# Patient Record
Sex: Male | Born: 1980
Health system: Southern US, Community
[De-identification: ages and names within clinical notes are randomized; demographics above are authoritative.]

## PROBLEM LIST (undated history)

## (undated) DIAGNOSIS — R51 Headache: Secondary | ICD-10-CM

## (undated) DIAGNOSIS — J301 Allergic rhinitis due to pollen: Secondary | ICD-10-CM

## (undated) DIAGNOSIS — E291 Testicular hypofunction: Secondary | ICD-10-CM

## (undated) DIAGNOSIS — Z8659 Personal history of other mental and behavioral disorders: Secondary | ICD-10-CM

## (undated) DIAGNOSIS — G8929 Other chronic pain: Secondary | ICD-10-CM

## (undated) DIAGNOSIS — R519 Headache, unspecified: Secondary | ICD-10-CM

## (undated) DIAGNOSIS — K219 Gastro-esophageal reflux disease without esophagitis: Secondary | ICD-10-CM

## (undated) DIAGNOSIS — M549 Dorsalgia, unspecified: Secondary | ICD-10-CM

## (undated) DIAGNOSIS — M5126 Other intervertebral disc displacement, lumbar region: Secondary | ICD-10-CM

## (undated) HISTORY — DX: Gastro-esophageal reflux disease without esophagitis: K21.9

## (undated) HISTORY — DX: Allergic rhinitis due to pollen: J30.1

## (undated) HISTORY — DX: Headache: R51

## (undated) HISTORY — DX: Testicular hypofunction: E29.1

## (undated) HISTORY — DX: Personal history of other mental and behavioral disorders: Z86.59

## (undated) HISTORY — DX: Headache, unspecified: R51.9

## (undated) HISTORY — PX: BACK SURGERY: SHX140

---

## 2006-05-14 ENCOUNTER — Emergency Department (HOSPITAL_COMMUNITY): Admission: EM | Admit: 2006-05-14 | Discharge: 2006-05-15 | Payer: Self-pay | Admitting: Emergency Medicine

## 2007-07-21 ENCOUNTER — Emergency Department (HOSPITAL_COMMUNITY): Admission: EM | Admit: 2007-07-21 | Discharge: 2007-07-21 | Payer: Self-pay | Admitting: Emergency Medicine

## 2008-01-23 ENCOUNTER — Emergency Department (HOSPITAL_COMMUNITY): Admission: EM | Admit: 2008-01-23 | Discharge: 2008-01-23 | Payer: Self-pay | Admitting: Emergency Medicine

## 2009-05-07 ENCOUNTER — Emergency Department (HOSPITAL_BASED_OUTPATIENT_CLINIC_OR_DEPARTMENT_OTHER): Admission: EM | Admit: 2009-05-07 | Discharge: 2009-05-08 | Payer: Self-pay | Admitting: Emergency Medicine

## 2010-01-25 ENCOUNTER — Ambulatory Visit: Payer: Self-pay | Admitting: Diagnostic Radiology

## 2010-01-25 ENCOUNTER — Emergency Department (HOSPITAL_BASED_OUTPATIENT_CLINIC_OR_DEPARTMENT_OTHER): Admission: EM | Admit: 2010-01-25 | Discharge: 2010-01-25 | Payer: Self-pay | Admitting: Emergency Medicine

## 2010-03-28 ENCOUNTER — Emergency Department (HOSPITAL_BASED_OUTPATIENT_CLINIC_OR_DEPARTMENT_OTHER): Admission: EM | Admit: 2010-03-28 | Discharge: 2010-03-28 | Payer: Self-pay | Admitting: Emergency Medicine

## 2010-03-28 ENCOUNTER — Ambulatory Visit: Payer: Self-pay | Admitting: Diagnostic Radiology

## 2011-02-13 LAB — CBC
Hemoglobin: 15.6 g/dL (ref 13.0–17.0)
MCHC: 34.1 g/dL (ref 30.0–36.0)
MCV: 85.7 fL (ref 78.0–100.0)
RBC: 5.34 MIL/uL (ref 4.22–5.81)
WBC: 12.7 10*3/uL — ABNORMAL HIGH (ref 4.0–10.5)

## 2011-02-13 LAB — DIFFERENTIAL
Basophils Absolute: 0.1 10*3/uL (ref 0.0–0.1)
Basophils Relative: 1 % (ref 0–1)
Lymphs Abs: 2.9 10*3/uL (ref 0.7–4.0)
Monocytes Absolute: 1.3 10*3/uL — ABNORMAL HIGH (ref 0.1–1.0)
Smear Review: NORMAL

## 2011-02-13 LAB — BASIC METABOLIC PANEL
BUN: 10 mg/dL (ref 6–23)
CO2: 26 mEq/L (ref 19–32)
Calcium: 9.5 mg/dL (ref 8.4–10.5)
GFR calc non Af Amer: >60 mL/min (ref >60)

## 2011-05-22 ENCOUNTER — Emergency Department (HOSPITAL_BASED_OUTPATIENT_CLINIC_OR_DEPARTMENT_OTHER): Payer: PRIVATE HEALTH INSURANCE

## 2011-05-22 ENCOUNTER — Encounter: Payer: Self-pay | Admitting: Emergency Medicine

## 2011-05-22 ENCOUNTER — Emergency Department (HOSPITAL_BASED_OUTPATIENT_CLINIC_OR_DEPARTMENT_OTHER)
Admission: EM | Admit: 2011-05-22 | Discharge: 2011-05-22 | Disposition: A | Discharge status: Home / Self Care | Payer: PRIVATE HEALTH INSURANCE | Attending: Emergency Medicine | Admitting: Emergency Medicine

## 2011-05-22 ENCOUNTER — Other Ambulatory Visit: Payer: Self-pay

## 2011-05-22 ENCOUNTER — Emergency Department (INDEPENDENT_AMBULATORY_CARE_PROVIDER_SITE_OTHER): Payer: PRIVATE HEALTH INSURANCE

## 2011-05-22 DIAGNOSIS — R079 Chest pain, unspecified: Secondary | ICD-10-CM

## 2011-05-22 DIAGNOSIS — R0789 Other chest pain: Secondary | ICD-10-CM

## 2011-05-22 DIAGNOSIS — R0602 Shortness of breath: Secondary | ICD-10-CM

## 2011-05-22 LAB — CBC
HCT: 41.8 % (ref 39.0–52.0)
MCH: 28.2 pg (ref 26.0–34.0)
MCV: 80.9 fL (ref 78.0–100.0)
Platelets: 245 10*3/uL (ref 150–400)
WBC: 9 10*3/uL (ref 4.0–10.5)

## 2011-05-22 LAB — CARDIAC PANEL(CRET KIN+CKTOT+MB+TROPI)
CK, MB: 1.4 ng/mL (ref 0.3–4.0)
Total CK: 163 U/L (ref 7–232)
Troponin I: <0.3 ng/mL (ref <0.30)

## 2011-05-22 LAB — BASIC METABOLIC PANEL
Chloride: 105 mEq/L (ref 96–112)
Potassium: 3.8 mEq/L (ref 3.5–5.1)
Sodium: 143 mEq/L (ref 135–145)

## 2011-05-22 NOTE — ED Notes (Signed)
No PCP 

## 2011-05-22 NOTE — ED Provider Notes (Signed)
History     Chief Complaint  Patient presents with  . Chest Pain   Patient is a 30 y.o. male presenting with chest pain. The history is provided by the patient.  Chest Pain The chest pain began 3 - 5 hours ago. Duration of episode(s) is 5 hours. Chest pain occurs constantly. The chest pain is unchanged. Associated with: nothing.  Was at rest when symptoms began. The severity of the pain is moderate. The quality of the pain is described as sharp. The pain does not radiate. Chest pain is worsened by deep breathing and certain positions. Pertinent negatives for primary symptoms include no fever, no fatigue, no syncope, no shortness of breath, no palpitations and no nausea. He tried nothing for the symptoms. Risk factors include smoking/tobacco exposure.     History reviewed. No pertinent past medical history.  History reviewed. No pertinent past surgical history.  History reviewed. No pertinent family history.  History  Substance Use Topics  . Smoking status: Current Everyday Smoker  . Smokeless tobacco: Not on file  . Alcohol Use: Yes     occ      Review of Systems  Constitutional: Negative.  Negative for fever and fatigue.  HENT: Negative.   Respiratory: Negative.  Negative for shortness of breath.   Cardiovascular: Positive for chest pain. Negative for palpitations and syncope.  Gastrointestinal: Negative for nausea.  All other systems reviewed and are negative.    Physical Exam  BP 142/81  Pulse 85  Temp(Src) 98.5 F (36.9 C) (Oral)  Resp 16  SpO2 99%  Physical Exam  Constitutional: He is oriented to person, place, and time. He appears well-developed and well-nourished. No distress.  HENT:  Head: Normocephalic and atraumatic.  Neck: Normal range of motion. Neck supple.  Cardiovascular: Normal rate, regular rhythm and normal heart sounds.   Pulmonary/Chest: Effort normal and breath sounds normal. No respiratory distress. He has no wheezes. He has no rales. He  exhibits no tenderness.  Abdominal: Soft. He exhibits no distension and no mass.  Musculoskeletal: Normal range of motion.  Neurological: He is alert and oriented to person, place, and time.  Skin: Skin is warm and dry. He is not diaphoretic.  Psychiatric: He has a normal mood and affect.    ED Course  Procedures  MDM Enzymes normal, EKG normal, CXR normal.  Symptoms atypical for cardiac pain.  Will discharge with anti-inflammatories.      Riley Lam [facility] 05/22/11 1930

## 2011-06-05 ENCOUNTER — Encounter (HOSPITAL_BASED_OUTPATIENT_CLINIC_OR_DEPARTMENT_OTHER): Payer: Self-pay | Admitting: *Deleted

## 2011-06-05 ENCOUNTER — Emergency Department (INDEPENDENT_AMBULATORY_CARE_PROVIDER_SITE_OTHER): Payer: PRIVATE HEALTH INSURANCE

## 2011-06-05 ENCOUNTER — Emergency Department (HOSPITAL_BASED_OUTPATIENT_CLINIC_OR_DEPARTMENT_OTHER)
Admission: EM | Admit: 2011-06-05 | Discharge: 2011-06-05 | Disposition: A | Discharge status: Home / Self Care | Payer: PRIVATE HEALTH INSURANCE | Attending: Emergency Medicine | Admitting: Emergency Medicine

## 2011-06-05 DIAGNOSIS — S62609A Fracture of unspecified phalanx of unspecified finger, initial encounter for closed fracture: Secondary | ICD-10-CM | POA: Insufficient documentation

## 2011-06-05 DIAGNOSIS — F319 Bipolar disorder, unspecified: Secondary | ICD-10-CM | POA: Insufficient documentation

## 2011-06-05 DIAGNOSIS — W219XXA Striking against or struck by unspecified sports equipment, initial encounter: Secondary | ICD-10-CM | POA: Insufficient documentation

## 2011-06-05 DIAGNOSIS — Y9364 Activity, baseball: Secondary | ICD-10-CM | POA: Insufficient documentation

## 2011-06-05 DIAGNOSIS — Y9239 Other specified sports and athletic area as the place of occurrence of the external cause: Secondary | ICD-10-CM | POA: Insufficient documentation

## 2011-06-05 DIAGNOSIS — X58XXXA Exposure to other specified factors, initial encounter: Secondary | ICD-10-CM

## 2011-06-05 MED ORDER — HYDROCODONE-ACETAMINOPHEN 5-500 MG PO TABS: 1.0000 | ORAL_TABLET | Freq: Four times a day (QID) | ORAL | Status: AC | PRN

## 2011-06-05 NOTE — ED Provider Notes (Signed)
History     Chief Complaint  Patient presents with  . Hand Pain   HPI Comments: Pt states that he was playing softball yesterday and the tip of his finger made contact with something and he has had pain and swelling since  Patient is a 30 y.o. male presenting with hand pain. The history is provided by the patient. No language interpreter was used.  Hand Pain This is a new problem. The current episode started yesterday. The problem occurs constantly. The problem has been unchanged. Associated symptoms include joint swelling. Pertinent negatives include no fever. The symptoms are aggravated by bending. He has tried nothing for the symptoms.    Past Medical History  Diagnosis Date  . Bipolar 1 disorder     History reviewed. No pertinent past surgical history.  No family history on file.  History  Substance Use Topics  . Smoking status: Current Everyday Smoker  . Smokeless tobacco: Not on file  . Alcohol Use: No     occ      Review of Systems  Constitutional: Negative.  Negative for fever.  Respiratory: Negative.   Cardiovascular: Negative.   Musculoskeletal: Positive for joint swelling.  Skin: Negative.   Neurological: Negative.     Physical Exam  BP 120/78  Pulse 86  Temp(Src) 98.5 F (36.9 C) (Oral)  Resp 20  SpO2 99%  Physical Exam  Nursing note and vitals reviewed. Constitutional: He is oriented to person, place, and time. He appears well-developed.  Cardiovascular: Normal rate and regular rhythm.   Pulmonary/Chest: Effort normal and breath sounds normal.  Musculoskeletal:       Hands: Neurological: He is alert and oriented to person, place, and time.  Skin: Skin is warm and dry.    ED Course  Procedures  Dg Finger Index Right  06/05/2011  *RADIOLOGY REPORT*  Clinical Data: Index finger injury.  Pain and swelling.  RIGHT INDEX FINGER 2+V  Comparison: None.  Findings: On the lateral view, small volar plate avulsion fractures are seen from the distal  and middle phalanges of the index finger. There is associated soft tissue swelling.  No evidence of dislocation.  IMPRESSION: Small volar plate avulsion fractures from the middle and distal phalanges.  Original Report Authenticated By: Danae Orleans, M.D.    MDM Pt splinted by nursing staff:will treat symptomatically and have follow up with orthopedics      Teressa Lower, NP 06/05/11 2056

## 2011-06-05 NOTE — ED Notes (Signed)
Right 2nd digit red, swollen, painful. Started after softball hit the tip of his finger.

## 2011-06-05 NOTE — ED Provider Notes (Signed)
Medical screening examination/treatment/procedure(s) were performed by non-physician practitioner and as supervising physician I was immediately available for consultation/collaboration.   Glynn Octave, MD 06/05/11 [phone]

## 2012-06-18 ENCOUNTER — Emergency Department (HOSPITAL_COMMUNITY): Payer: Self-pay

## 2012-06-18 ENCOUNTER — Encounter (HOSPITAL_COMMUNITY): Payer: Self-pay

## 2012-06-18 ENCOUNTER — Emergency Department (HOSPITAL_COMMUNITY)
Admission: EM | Admit: 2012-06-18 | Discharge: 2012-06-18 | Disposition: A | Discharge status: Home / Self Care | Payer: Self-pay | Attending: Emergency Medicine | Admitting: Emergency Medicine

## 2012-06-18 DIAGNOSIS — F319 Bipolar disorder, unspecified: Secondary | ICD-10-CM | POA: Insufficient documentation

## 2012-06-18 DIAGNOSIS — M549 Dorsalgia, unspecified: Secondary | ICD-10-CM | POA: Insufficient documentation

## 2012-06-18 DIAGNOSIS — R0602 Shortness of breath: Secondary | ICD-10-CM | POA: Insufficient documentation

## 2012-06-18 DIAGNOSIS — F17200 Nicotine dependence, unspecified, uncomplicated: Secondary | ICD-10-CM | POA: Insufficient documentation

## 2012-06-18 DIAGNOSIS — R269 Unspecified abnormalities of gait and mobility: Secondary | ICD-10-CM | POA: Insufficient documentation

## 2012-06-18 DIAGNOSIS — R32 Unspecified urinary incontinence: Secondary | ICD-10-CM | POA: Insufficient documentation

## 2012-06-18 LAB — CBC WITH DIFFERENTIAL/PLATELET
Basophils Absolute: 0.1 10*3/uL (ref 0.0–0.1)
Basophils Relative: 1 % (ref 0–1)
Eosinophils Relative: 4 % (ref 0–5)
HCT: 42.1 % (ref 39.0–52.0)
Hemoglobin: 14.6 g/dL (ref 13.0–17.0)
Hemoglobin: 14.3 g/dL (ref 13.0–17.0)
Lymphocytes Relative: 27 % (ref 12–46)
Lymphs Abs: 2.8 10*3/uL (ref 0.7–4.0)
MCH: 29.5 pg (ref 26.0–34.0)
MCHC: 34.7 g/dL (ref 30.0–36.0)
MCV: 86.4 fL (ref 78.0–100.0)
Monocytes Absolute: 0.9 10*3/uL (ref 0.1–1.0)
Monocytes Relative: 8 % (ref 3–12)
Monocytes Relative: 7 % (ref 3–12)
Neutro Abs: 6.6 10*3/uL (ref 1.7–7.7)
Neutrophils Relative %: 63 % (ref 43–77)
Platelets: 253 10*3/uL (ref 150–400)
RBC: 4.84 MIL/uL (ref 4.22–5.81)
RDW: 12.8 % (ref 11.5–15.5)
WBC: 10.6 10*3/uL — ABNORMAL HIGH (ref 4.0–10.5)

## 2012-06-18 LAB — BASIC METABOLIC PANEL
BUN: 14 mg/dL (ref 6–23)
CO2: 26 mEq/L (ref 19–32)
Chloride: 107 mEq/L (ref 96–112)
GFR calc non Af Amer: 86 mL/min — ABNORMAL LOW (ref >90)
Glucose, Bld: 89 mg/dL (ref 70–99)
Potassium: 3.8 mEq/L (ref 3.5–5.1)
Sodium: 143 mEq/L (ref 135–145)

## 2012-06-18 LAB — COMPREHENSIVE METABOLIC PANEL
AST: 18 U/L (ref 0–37)
Albumin: 4.4 g/dL (ref 3.5–5.2)
BUN: 13 mg/dL (ref 6–23)
CO2: 26 mEq/L (ref 19–32)
Calcium: 9.6 mg/dL (ref 8.4–10.5)
Chloride: 105 mEq/L (ref 96–112)
Creatinine, Ser: 1.12 mg/dL (ref 0.50–1.35)
GFR calc non Af Amer: 86 mL/min — ABNORMAL LOW (ref >90)
Total Bilirubin: 0.2 mg/dL — ABNORMAL LOW (ref 0.3–1.2)

## 2012-06-18 LAB — URINALYSIS, ROUTINE W REFLEX MICROSCOPIC
Bilirubin Urine: NEGATIVE
Glucose, UA: NEGATIVE mg/dL
Hgb urine dipstick: NEGATIVE
Ketones, ur: NEGATIVE mg/dL
Protein, ur: NEGATIVE mg/dL
pH: 6 (ref 5.0–8.0)

## 2012-06-18 MED ORDER — DIPHENHYDRAMINE HCL 50 MG/ML IJ SOLN
INTRAMUSCULAR | Status: AC
  Filled 2012-06-18: qty 1

## 2012-06-18 MED ORDER — MORPHINE SULFATE 4 MG/ML IJ SOLN
4.0000 mg | Freq: Once | INTRAMUSCULAR | Status: AC
  Administered 2012-06-18: 4 mg via INTRAVENOUS
  Filled 2012-06-18: qty 1

## 2012-06-18 MED ORDER — DIPHENHYDRAMINE HCL 50 MG/ML IJ SOLN
25.0000 mg | Freq: Once | INTRAMUSCULAR | Status: AC
  Administered 2012-06-18: 25 mg via INTRAVENOUS

## 2012-06-18 MED ORDER — SODIUM CHLORIDE 0.9 % IV BOLUS (SEPSIS)
1000.0000 mL | Freq: Once | INTRAVENOUS | Status: AC
  Administered 2012-06-18: 1000 mL via INTRAVENOUS

## 2012-06-18 MED ORDER — OXYCODONE-ACETAMINOPHEN 5-325 MG PO TABS: 1.0000 | ORAL_TABLET | ORAL | Status: AC | PRN

## 2012-06-18 MED ORDER — CYCLOBENZAPRINE HCL 10 MG PO TABS: 10.0000 mg | ORAL_TABLET | Freq: Two times a day (BID) | ORAL | Status: AC | PRN

## 2012-06-18 NOTE — ED Provider Notes (Signed)
Patient complains of mild to moderate continuing pain. MRI shows urinary distention, mild disc bulging without significant canal narrowing. Patient has urinated with complete control, no incontinence. No retention. He reports he feels ready for discharge home.   Rodena Medin, PA-C 06/18/12 2204

## 2012-06-18 NOTE — ED Provider Notes (Signed)
History     CSN: [id number]  Arrival date & time 06/18/12  1516   First MD Initiated Contact with Patient 06/18/12 1728      Chief Complaint  Patient presents with  . Back Pain  . Shortness of Breath  . Urinary Incontinence    (Consider location/radiation/quality/duration/timing/severity/associated sxs/prior treatment) HPI  31 year old male with history of bipolar presents complaining of back pain. Patient reports he has low back pain ongoing for several years.  Usually receive cortisone shots from orthopeic,  Dr. Lawernce Keas.  Sts today while standing in the kitchen he notice loss of urinary continence by peeing on himself.  Also reports having increase weakness to his L leg, with difficulty walking.  He denies any significant increase in his usual low back pain.  Denies fever, chills, abd pain, rash, caudal equina sxs.  Denies any recent strenuous exercise.    Past Medical History  Diagnosis Date  . Bipolar 1 disorder     No past surgical history on file.  No family history on file.  History  Substance Use Topics  . Smoking status: Current Everyday Smoker  . Smokeless tobacco: Not on file  . Alcohol Use: No     occ      Review of Systems  Constitutional: Negative for fever.  Musculoskeletal: Positive for back pain and gait problem.  Skin: Negative for rash.  Neurological: Positive for numbness.  All other systems reviewed and are negative.    Allergies  Review of patient's allergies indicates no known allergies.  Home Medications   Current Outpatient Rx  Name Route Sig Dispense Refill  . SERTRALINE HCL 50 MG PO TABS Oral Take 50 mg by mouth daily.       BP 139/84  Pulse 85  Temp 98.3 F (36.8 C) (Oral)  Resp 18  SpO2 100%  Physical Exam  Nursing note and vitals reviewed. Constitutional: He is oriented to person, place, and time. He appears well-nourished. No distress.  HENT:  Head: Atraumatic.  Eyes: Conjunctivae are normal.  Neck: Normal range  of motion. Neck supple.  Abdominal: Soft. There is no tenderness.  Musculoskeletal: He exhibits no edema.       Cervical back: Normal.       Thoracic back: Normal.       Lumbar back: He exhibits tenderness, bony tenderness and pain. He exhibits normal range of motion, no swelling, no edema, no deformity and no laceration.  Neurological: He is alert and oriented to person, place, and time.       Increase low back pain with straight leg test.  5/5 strength to R lower extremity, 4/5 strength to L lower extremity.  Mild evidence of foot drop.  Patella DTR 2+ on R, 3+ on L.    Decreased rectal tone on exam.  Gait mildly antalgic, favoring R side.   Skin: Skin is warm. No rash noted.  Psychiatric: He has a normal mood and affect.    ED Course  Procedures (including critical care time)   Labs Reviewed  URINALYSIS, ROUTINE W REFLEX MICROSCOPIC  CBC WITH DIFFERENTIAL  COMPREHENSIVE METABOLIC PANEL   Results for orders placed during the hospital encounter of 06/18/12  URINALYSIS, ROUTINE W REFLEX MICROSCOPIC      Component Value Range   Color, Urine YELLOW  YELLOW   APPearance CLEAR  CLEAR   Specific Gravity, Urine 1.029  1.005 - 1.030   pH 6.0  5.0 - 8.0   Glucose, UA NEGATIVE  NEGATIVE mg/dL  Hgb urine dipstick NEGATIVE  NEGATIVE   Bilirubin Urine NEGATIVE  NEGATIVE   Ketones, ur NEGATIVE  NEGATIVE mg/dL   Protein, ur NEGATIVE  NEGATIVE mg/dL   Urobilinogen, UA 1.0  0.0 - 1.0 mg/dL   Nitrite NEGATIVE  NEGATIVE   Leukocytes, UA NEGATIVE  NEGATIVE  CBC WITH DIFFERENTIAL      Component Value Range   WBC 10.5  4.0 - 10.5 K/uL   RBC 4.87  4.22 - 5.81 MIL/uL   Hemoglobin 14.6  13.0 - 17.0 g/dL   HCT 16.1  09.6 - 04.5 %   MCV 86.4  78.0 - 100.0 fL   MCH 30.0  26.0 - 34.0 pg   MCHC 34.7  30.0 - 36.0 g/dL   RDW 40.9  81.1 - 91.4 %   Platelets 264  150 - 400 K/uL   Neutrophils Relative 60  43 - 77 %   Neutro Abs 6.4  1.7 - 7.7 K/uL   Lymphocytes Relative 27  12 - 46 %   Lymphs Abs  2.9  0.7 - 4.0 K/uL   Monocytes Relative 8  3 - 12 %   Monocytes Absolute 0.9  0.1 - 1.0 K/uL   Eosinophils Relative 4  0 - 5 %   Eosinophils Absolute 0.4  0.0 - 0.7 K/uL   Basophils Relative 1  0 - 1 %   Basophils Absolute 0.1  0.0 - 0.1 K/uL  COMPREHENSIVE METABOLIC PANEL      Component Value Range   Sodium 141  135 - 145 mEq/L   Potassium 3.8  3.5 - 5.1 mEq/L   Chloride 105  96 - 112 mEq/L   CO2 26  19 - 32 mEq/L   Glucose, Bld 88  70 - 99 mg/dL   BUN 13  6 - 23 mg/dL   Creatinine, Ser 7.82  0.50 - 1.35 mg/dL   Calcium 9.6  8.4 - 95.6 mg/dL   Total Protein 7.9  6.0 - 8.3 g/dL   Albumin 4.4  3.5 - 5.2 g/dL   AST 18  0 - 37 U/L   ALT 15  0 - 53 U/L   Alkaline Phosphatase 56  39 - 117 U/L   Total Bilirubin 0.2 (*) 0.3 - 1.2 mg/dL   GFR calc non Af Amer 86 (*) >90 mL/min   GFR calc Af Amer >90  >90 mL/min  CBC WITH DIFFERENTIAL      Component Value Range   WBC 10.6 (*) 4.0 - 10.5 K/uL   RBC 4.84  4.22 - 5.81 MIL/uL   Hemoglobin 14.3  13.0 - 17.0 g/dL   HCT 21.3  08.6 - 57.8 %   MCV 86.4  78.0 - 100.0 fL   MCH 29.5  26.0 - 34.0 pg   MCHC 34.2  30.0 - 36.0 g/dL   RDW 46.9  62.9 - 52.8 %   Platelets 253  150 - 400 K/uL   Neutrophils Relative 63  43 - 77 %   Neutro Abs 6.6  1.7 - 7.7 K/uL   Lymphocytes Relative 27  12 - 46 %   Lymphs Abs 2.8  0.7 - 4.0 K/uL   Monocytes Relative 7  3 - 12 %   Monocytes Absolute 0.8  0.1 - 1.0 K/uL   Eosinophils Relative 3  0 - 5 %   Eosinophils Absolute 0.3  0.0 - 0.7 K/uL   Basophils Relative 1  0 - 1 %   Basophils Absolute 0.1  0.0 -  0.1 K/uL   No results found.  1. Back pain 2. Urinary incontinence  MDM  Low back pain with urinary incontinence and L side foot drop.  Plan to obtain MRI of Lspine for further evaluation.  Discussed with my attending.    Nursing notes reviewed and considered in documentation  Previous records reviewed and considered  All labs/vitals reviewed and considered  xrays reviewed and considered   6:45  PM Discussed care with Langley Adie, PA-C who agrees to monitoring pt and f/u in MRI result.    BP 139/84  Pulse 85  Temp 98.3 F (36.8 C) (Oral)  Resp 18  SpO2 100%   7:30 PM Pt developed a mild reaction to morphine with rash and itch.  It has improved with benadryl.      Fayrene Helper, PA-C 06/18/12 1846  Fayrene Helper, PA-C 06/18/12 1930

## 2012-06-18 NOTE — ED Provider Notes (Addendum)
Randall Abbott is a 31 y.o. male with ongoing back pain, and new onset left foot, weakness, causing falling while climbing stairs, as well as urinary incontinence, today. Patient is alert, calm, and cooperative. While resting supine. He has good dorsi flexion of the feet, bilaterally.   Evaluation to proceed with MR of the lumbar spine. He is transferred to the clinical decision unit.   Medical screening examination/treatment/procedure(s) were conducted as a shared visit with non-physician practitioner(s) and myself.  I personally evaluated the patient during the encounter    Flint Melter, MD 06/18/12 [place]  Flint Melter, MD 06/19/12 [phone]

## 2012-06-18 NOTE — ED Notes (Signed)
Low back pain ongoing for several years, urinary incontinence and sts left foot and right foot "drops" and he trips often

## 2012-06-19 NOTE — ED Provider Notes (Signed)
Medical screening examination/treatment/procedure(s) were performed by non-physician practitioner and as supervising physician I was immediately available for consultation/collaboration.  Flint Melter, MD 06/19/12 [phone]

## 2012-09-24 IMAGING — CR DG CHEST 2V
2 series · 2 of 2 positions shown · non-contrast
Comparison: 08/22/2008

CLINICAL DATA: Chest pain.  Shortness of breath.

CHEST - 2 VIEW

[w chest pa]
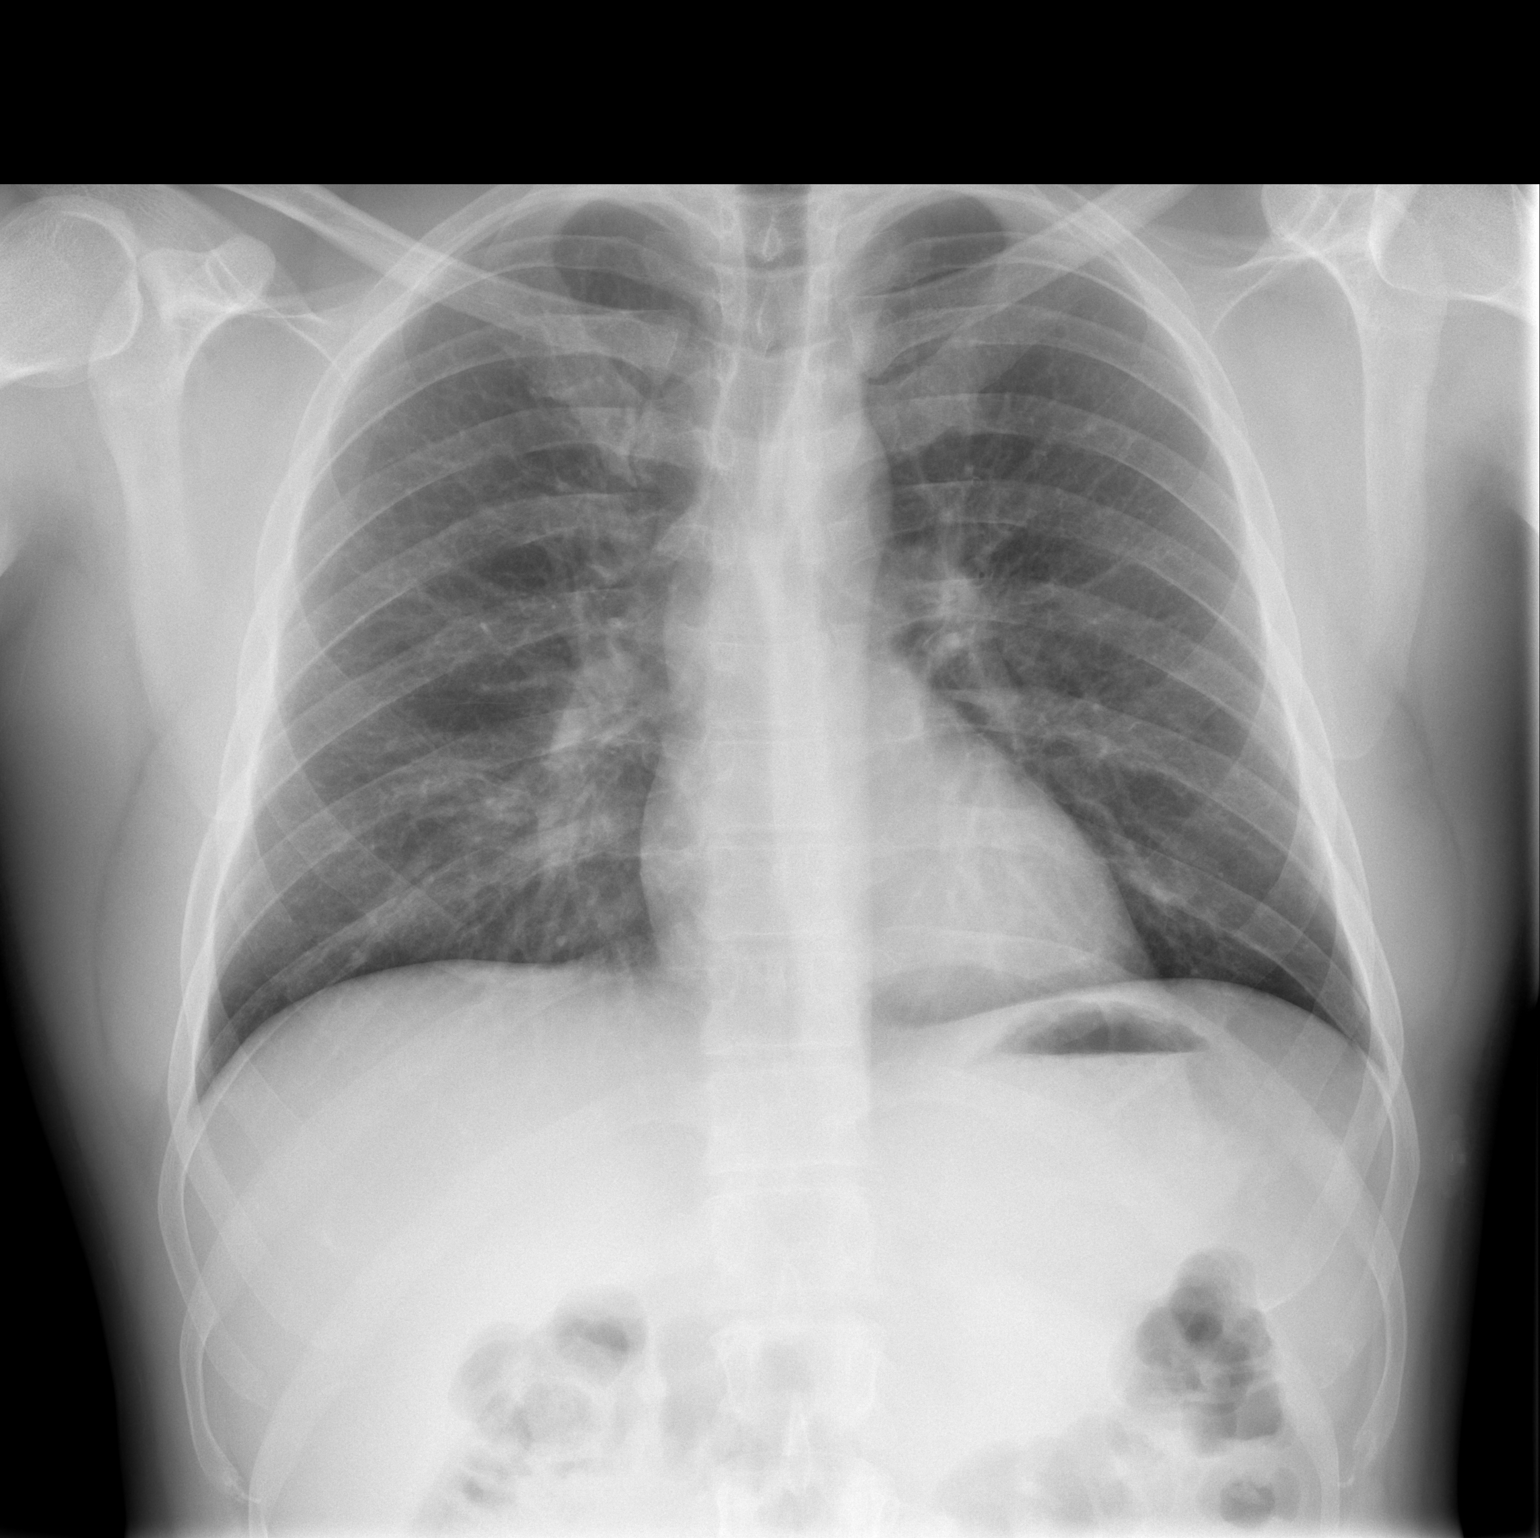

[w chest lat]
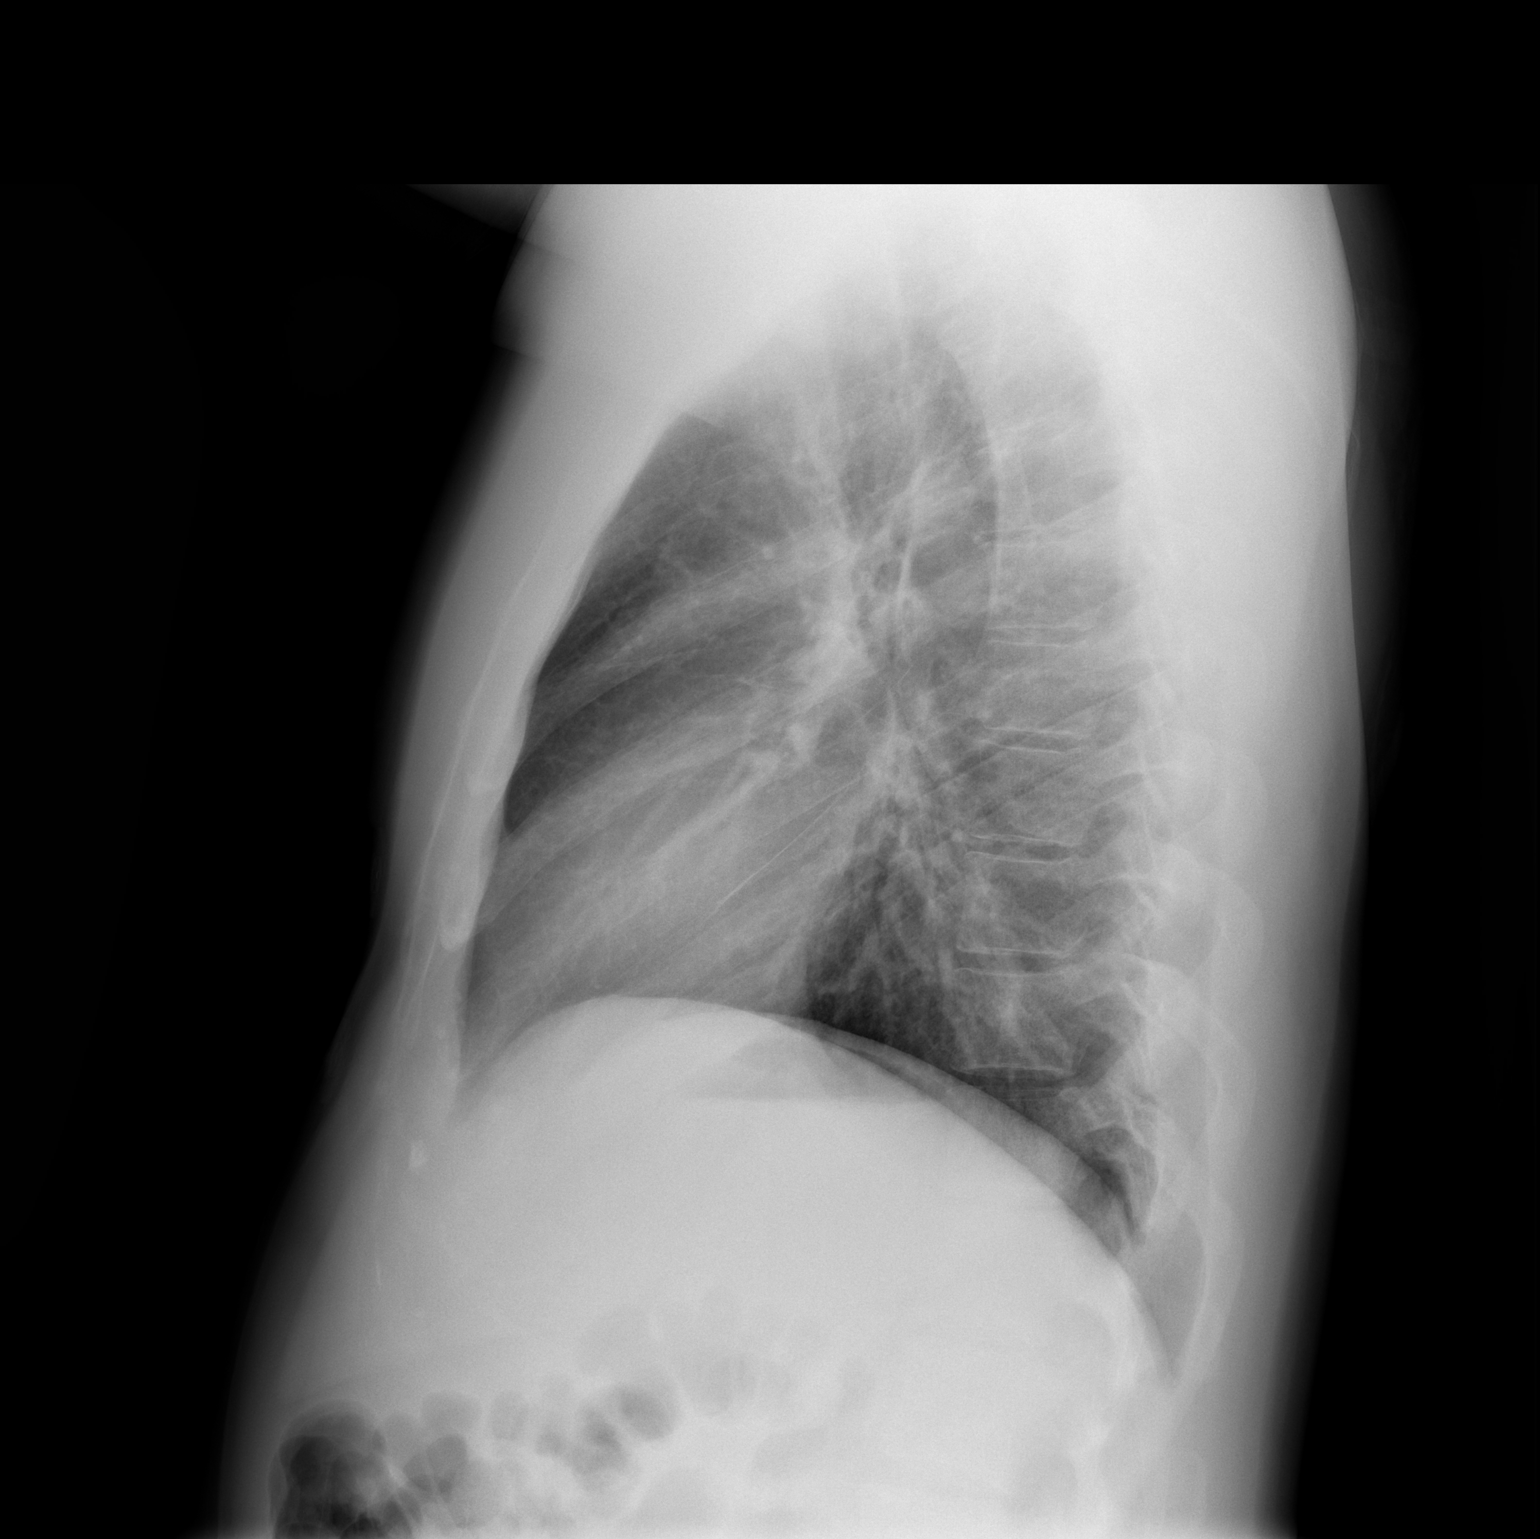

[2 of 2 positions shown; findings below may reference images not displayed]

FINDINGS: Airway thickening may reflect bronchitis or reactive
airways disease.  No airspace opacity is identified to suggest
bacterial pneumonia pattern.  Cardiac and mediastinal contours
appear unremarkable.

No pleural effusion identified.
IMPRESSION: 1. Airway thickening may reflect bronchitis or reactive airways
disease.  No airspace opacity is identified to suggest bacterial
pneumonia pattern.

## 2013-05-13 ENCOUNTER — Emergency Department (HOSPITAL_BASED_OUTPATIENT_CLINIC_OR_DEPARTMENT_OTHER): Payer: BC Managed Care – PPO

## 2013-05-13 ENCOUNTER — Emergency Department (HOSPITAL_BASED_OUTPATIENT_CLINIC_OR_DEPARTMENT_OTHER)
Admission: EM | Admit: 2013-05-13 | Discharge: 2013-05-13 | Disposition: A | Discharge status: Home / Self Care | Payer: BC Managed Care – PPO | Attending: Emergency Medicine | Admitting: Emergency Medicine

## 2013-05-13 ENCOUNTER — Encounter (HOSPITAL_BASED_OUTPATIENT_CLINIC_OR_DEPARTMENT_OTHER): Payer: Self-pay | Admitting: *Deleted

## 2013-05-13 DIAGNOSIS — S4980XA Other specified injuries of shoulder and upper arm, unspecified arm, initial encounter: Secondary | ICD-10-CM | POA: Insufficient documentation

## 2013-05-13 DIAGNOSIS — S46909A Unspecified injury of unspecified muscle, fascia and tendon at shoulder and upper arm level, unspecified arm, initial encounter: Secondary | ICD-10-CM | POA: Insufficient documentation

## 2013-05-13 DIAGNOSIS — M25511 Pain in right shoulder: Secondary | ICD-10-CM

## 2013-05-13 DIAGNOSIS — Y9289 Other specified places as the place of occurrence of the external cause: Secondary | ICD-10-CM | POA: Insufficient documentation

## 2013-05-13 DIAGNOSIS — F319 Bipolar disorder, unspecified: Secondary | ICD-10-CM | POA: Insufficient documentation

## 2013-05-13 DIAGNOSIS — S6990XA Unspecified injury of unspecified wrist, hand and finger(s), initial encounter: Secondary | ICD-10-CM | POA: Insufficient documentation

## 2013-05-13 DIAGNOSIS — W19XXXA Unspecified fall, initial encounter: Secondary | ICD-10-CM

## 2013-05-13 DIAGNOSIS — W010XXA Fall on same level from slipping, tripping and stumbling without subsequent striking against object, initial encounter: Secondary | ICD-10-CM | POA: Insufficient documentation

## 2013-05-13 DIAGNOSIS — F17200 Nicotine dependence, unspecified, uncomplicated: Secondary | ICD-10-CM | POA: Insufficient documentation

## 2013-05-13 DIAGNOSIS — Z79899 Other long term (current) drug therapy: Secondary | ICD-10-CM | POA: Insufficient documentation

## 2013-05-13 DIAGNOSIS — S59909A Unspecified injury of unspecified elbow, initial encounter: Secondary | ICD-10-CM | POA: Insufficient documentation

## 2013-05-13 DIAGNOSIS — Y9389 Activity, other specified: Secondary | ICD-10-CM | POA: Insufficient documentation

## 2013-05-13 DIAGNOSIS — M25521 Pain in right elbow: Secondary | ICD-10-CM

## 2013-05-13 MED ORDER — IBUPROFEN 600 MG PO TABS: 600.0000 mg | ORAL_TABLET | Freq: Four times a day (QID) | ORAL | Status: DC | PRN

## 2013-05-13 NOTE — ED Notes (Signed)
Was playing with his kids and tripped over a toy a few nights ago. Pain in his right elbow and right shoulder.

## 2013-05-13 NOTE — ED Provider Notes (Signed)
History  This chart was scribed for Randall Chick, MD by Ardeen Jourdain, ED Scribe. This patient was seen in room MH09/MH09 and the patient's care was started at 2104.  CSN: [id number] Arrival date & time 05/13/13  2024   First MD Initiated Contact with Patient 05/13/13 2104     Chief Complaint  Patient presents with  . Arm Pain    Patient is a 32 y.o. male presenting with arm pain. The history is provided by the patient. No language interpreter was used.  Arm Pain This is a new problem. The current episode started 2 days ago. The problem occurs constantly. The problem has been gradually worsening. Pertinent negatives include no chest pain, no abdominal pain, no headaches and no shortness of breath. The symptoms are aggravated by bending and twisting. Nothing relieves the symptoms. He has tried nothing for the symptoms.    HPI Comments: Randall Abbott is a 32 y.o. male who presents to the Emergency Department complaining of sudden onset, gradually worsening, constant right arm pain.  Pain with movement and limited range of motion. He states he was playing with his kids when he tripped over a toy and fell on his outstretched arm. He states the pain has radiates to his elbow and shoulder. He denies any other injuries or symptoms at this time.   Past Medical History  Diagnosis Date  . Bipolar 1 disorder    History reviewed. No pertinent past surgical history. No family history on file. History  Substance Use Topics  . Smoking status: Current Every Day Smoker -- 0.50 packs/day    Types: Cigarettes  . Smokeless tobacco: Not on file  . Alcohol Use: No     Comment: occ    Review of Systems  Respiratory: Negative for shortness of breath.   Cardiovascular: Negative for chest pain.  Gastrointestinal: Negative for abdominal pain.  Musculoskeletal:       Right arm pain   Neurological: Negative for headaches.  All other systems reviewed and are negative.    Allergies  Morphine and  related  Home Medications   Current Outpatient Rx  Name  Route  Sig  Dispense  Refill  . ibuprofen (ADVIL,MOTRIN) 600 MG tablet   Oral   Take 1 tablet (600 mg total) by mouth every 6 (six) hours as needed for pain.   30 tablet   0   . sertraline (ZOLOFT) 50 MG tablet   Oral   Take 50 mg by mouth daily.           Triage Vitals: BP 144/86  Pulse 79  Temp(Src) 98.4 F (36.9 C) (Oral)  Resp 20  Wt 170 lb (77.111 kg)  SpO2 99%  Physical Exam  Nursing note and vitals reviewed. Constitutional: He is oriented to person, place, and time. He appears well-developed and well-nourished. No distress.  HENT:  Head: Normocephalic and atraumatic.  Eyes: EOM are normal. Pupils are equal, round, and reactive to light.  Neck: Normal range of motion. Neck supple. No tracheal deviation present.  Cardiovascular: Normal rate.   Pulmonary/Chest: Effort normal. No respiratory distress.  Abdominal: Soft. He exhibits no distension.  Musculoskeletal: Normal range of motion. He exhibits no edema.  Point tenderness over right olecranon. Diffuse mild tenderness of right shoulder. Full ROM of right upper extremity. No deformity. No swelling.   Neurological: He is alert and oriented to person, place, and time.  Right shoulder is distally NVI   Skin: Skin is warm and dry.  Psychiatric: He has a normal mood and affect. His behavior is normal.  Note- strength 5/5 in upper extremities x 2, lungs CTA B, CV RRR, no midine tenderness of cervical/thoracic/lumbar spine  ED Course  Procedures (including critical care time)  DIAGNOSTIC STUDIES: Oxygen Saturation is 99% on room air, normal by my interpretation.    COORDINATION OF CARE:  9:24 PM-Discussed treatment plan which includes x-ray of the right shoulder and right elbow with pt at bedside and pt agreed to plan.   Labs Reviewed - No data to display Dg Shoulder Right  05/13/2013   *RADIOLOGY REPORT*  Clinical Data: Right shoulder and elbow pain.   Injury.  RIGHT SHOULDER - 2+ VIEW  Comparison: None.  Findings: No acute bony abnormality.  Specifically, no fracture, subluxation, or dislocation.  Soft tissues are intact. Joint spaces are maintained.  Normal bone mineralization.  IMPRESSION: No bony abnormality.   Original Report Authenticated By: Charlett Nose, M.D.   Dg Elbow Complete Right  05/13/2013   *RADIOLOGY REPORT*  Clinical Data: injury, pain.  RIGHT ELBOW - COMPLETE 3+ VIEW  Comparison: None.  Findings: No acute bony abnormality.  Specifically, no fracture, subluxation, or dislocation.  Soft tissues are intact. Joint spaces are maintained.  Normal bone mineralization.  No joint effusion.  IMPRESSION: Negative.   Original Report Authenticated By: Charlett Nose, M.D.   1. Elbow pain, right   2. Shoulder pain, right   3. Fall, initial encounter     MDM  Pt presenting with shoulder and elbow pain after fall several days ago onto outstretched hand.  Pt has had xrays that are reassuring.  Given information for ortho followup if symptoms persist.  Discharged with strict return precautions.  Pt agreeable with plan.   I personally performed the services described in this documentation, which was scribed in my presence. The recorded information has been reviewed and is accurate.    Randall Chick, MD 05/14/13 [phone]

## 2013-10-14 ENCOUNTER — Emergency Department (HOSPITAL_BASED_OUTPATIENT_CLINIC_OR_DEPARTMENT_OTHER)
Admission: EM | Admit: 2013-10-14 | Discharge: 2013-10-14 | Disposition: A | Discharge status: Home / Self Care | Payer: BC Managed Care – PPO | Attending: Emergency Medicine | Admitting: Emergency Medicine

## 2013-10-14 ENCOUNTER — Encounter (HOSPITAL_BASED_OUTPATIENT_CLINIC_OR_DEPARTMENT_OTHER): Payer: Self-pay | Admitting: Emergency Medicine

## 2013-10-14 ENCOUNTER — Emergency Department (HOSPITAL_BASED_OUTPATIENT_CLINIC_OR_DEPARTMENT_OTHER): Payer: BC Managed Care – PPO

## 2013-10-14 DIAGNOSIS — J069 Acute upper respiratory infection, unspecified: Secondary | ICD-10-CM

## 2013-10-14 DIAGNOSIS — Z79899 Other long term (current) drug therapy: Secondary | ICD-10-CM | POA: Insufficient documentation

## 2013-10-14 DIAGNOSIS — F319 Bipolar disorder, unspecified: Secondary | ICD-10-CM | POA: Insufficient documentation

## 2013-10-14 DIAGNOSIS — F17200 Nicotine dependence, unspecified, uncomplicated: Secondary | ICD-10-CM | POA: Insufficient documentation

## 2013-10-14 MED ORDER — FLUTICASONE PROPIONATE 50 MCG/ACT NA SUSP: 2.0000 | Freq: Every day | NASAL | Status: DC

## 2013-10-14 MED ORDER — HYDROCOD POLST-CHLORPHEN POLST 10-8 MG/5ML PO LQCR: 5.0000 mL | Freq: Two times a day (BID) | ORAL | Status: DC | PRN

## 2013-10-14 NOTE — ED Provider Notes (Signed)
CSN: [id number]     Arrival date & time 10/14/13  1147 History   First MD Initiated Contact with Patient 10/14/13 1223     Chief Complaint  Patient presents with  . URI   (Consider location/radiation/quality/duration/timing/severity/associated sxs/prior Treatment) HPI Comments: Burning sinuses  Patient is a 32 y.o. male presenting with URI. The history is provided by the patient. No language interpreter was used.  URI Presenting symptoms: congestion, cough, fever, rhinorrhea and sore throat   Severity:  Mild Onset quality:  Sudden Timing:  Constant Progression:  Unchanged Chronicity:  New Relieved by:  Nothing Worsened by:  Nothing tried Ineffective treatments:  None tried   Past Medical History  Diagnosis Date  . Bipolar 1 disorder    History reviewed. No pertinent past surgical history. No family history on file. History  Substance Use Topics  . Smoking status: Current Every Day Smoker -- 0.50 packs/day    Types: Cigarettes  . Smokeless tobacco: Not on file  . Alcohol Use: No     Comment: occ    Review of Systems  Constitutional: Positive for fever.  HENT: Positive for congestion, rhinorrhea and sore throat.   Respiratory: Positive for cough.     Allergies  Morphine and related  Home Medications   Current Outpatient Rx  Name  Route  Sig  Dispense  Refill  . TESTOSTERONE BU   Buccal   Place inside cheek.         Marland Kitchen ibuprofen (ADVIL,MOTRIN) 600 MG tablet   Oral   Take 1 tablet (600 mg total) by mouth every 6 (six) hours as needed for pain.   30 tablet   0   . sertraline (ZOLOFT) 50 MG tablet   Oral   Take 50 mg by mouth daily.           BP 133/94  Pulse 94  Temp(Src) 98.5 F (36.9 C) (Oral)  Resp 20  Ht 5\' 9"  (1.753 m)  Wt 170 lb (77.111 kg)  BMI 25.09 kg/m2  SpO2 100% Physical Exam  Nursing note and vitals reviewed. Constitutional: He appears well-developed and well-nourished.  HENT:  Head: Normocephalic and atraumatic.  Right Ear:  External ear normal.  Left Ear: External ear normal.  Nose: Rhinorrhea present.  Mouth/Throat: Posterior oropharyngeal erythema present.  Eyes: Conjunctivae are normal.  Cardiovascular: Normal rate and regular rhythm.   Pulmonary/Chest: Effort normal. He has rales.    ED Course  Procedures (including critical care time) Labs Review Labs Reviewed - No data to display Imaging Review Dg Chest 2 View  10/14/2013   CLINICAL DATA:  Cough, sore throat  EXAM: CHEST  2 VIEW  COMPARISON:  05/22/2011  FINDINGS: The heart size and mediastinal contours are within normal limits. No acute infiltrate or pulmonary edema. Mild perihilar increased bronchial markings without focal consolidation.  IMPRESSION: No active cardiopulmonary disease.   Electronically Signed   By: Natasha Mead M.D.   On: 10/14/2013 13:02    EKG Interpretation   None       MDM   1. URI (upper respiratory infection)    Will treat symptomatically    Teressa Lower, NP 10/14/13 1527

## 2013-10-14 NOTE — ED Notes (Signed)
Sore throat, cough and aching all over. States his sinus feel like they are on fire.

## 2013-10-15 ENCOUNTER — Ambulatory Visit: Payer: PRIVATE HEALTH INSURANCE | Admitting: Family Medicine

## 2013-10-18 NOTE — ED Provider Notes (Signed)
Medical screening examination/treatment/procedure(s) were performed by non-physician practitioner and as supervising physician I was immediately available for consultation/collaboration.  EKG Interpretation   None         Candyce Churn, MD 10/18/13 1500

## 2013-11-22 ENCOUNTER — Encounter: Payer: Self-pay | Admitting: Family Medicine

## 2013-11-22 ENCOUNTER — Ambulatory Visit (INDEPENDENT_AMBULATORY_CARE_PROVIDER_SITE_OTHER): Payer: BC Managed Care – PPO | Admitting: Family Medicine

## 2013-11-22 VITALS — BP 147/82 | HR 76 | Temp 99.0°F | Resp 18 | Ht 71.0 in | Wt 176.0 lb

## 2013-11-22 DIAGNOSIS — R7989 Other specified abnormal findings of blood chemistry: Secondary | ICD-10-CM | POA: Insufficient documentation

## 2013-11-22 DIAGNOSIS — E291 Testicular hypofunction: Secondary | ICD-10-CM

## 2013-11-22 MED ORDER — TESTOSTERONE CYPIONATE 100 MG/ML IM SOLN: INTRAMUSCULAR | Status: DC

## 2013-11-22 NOTE — Progress Notes (Signed)
Office Note 11/22/2013  CC:  Chief Complaint  Patient presents with  . Establish Care    HPI:  Randall Abbott is a 33 y.o. White male who is here to establish care and discuss testosterone injections. Patient's most recent primary MD: [facility] in [place]. Old records in EPIC/[facility] EMR were reviewed prior to or during today's visit.  Around early November 2014 patient presented to his PCP for fatigue, trouble sleeping, no sex drive, erectile problems, and says his testicles were "very small".  The symptoms had been occuring over the course of 2-3 months (including testicular size change).  He had some blood tests and eventually was dx'd with male hypogonadism and was started on testosterone injections.  He got 2 testosterone injections about 2 weeks apart. In between these two shots he got some blood testing done again and says the dosing was then changed to 100mg  weekly and he has been taking this dose up until 6 days ago--he is now out of testosterone and wishes to get this refilled today.  He does say he feels much improved regarding his initial presenting symptoms but is not 100%, also says his testicles have grown some in size since getting on the testosterone injections but he still feels like they are not "normal" size for him.  He asks about the possibility of getting beta HCG injections and/or vit B12 injections with the testosterone, says he has been doing some reading and has heard of doing this.  Past Medical History  Diagnosis Date  . History of mood disorder     adjustment d/o with depression features: no meds since about 2012  . GERD (gastroesophageal reflux disease)     diet controlled  . Hay fever   . Frequent headaches     subsided significantly when he got corrective lenses  . Hypogonadism male     presenting sx's: fatigue, no libido, ED, insomnia    History reviewed. No pertinent past surgical history.  Family History  Problem Relation Age of  Onset  . Hypertension Mother   . Cancer Maternal Grandfather     History   Social History  . Marital Status: Married    Spouse Name: N/A    Number of Children: N/A  . Years of Education: N/A   Occupational History  . Not on file.   Social History Main Topics  . Smoking status: Current Every Day Smoker -- 0.50 packs/day    Types: Cigarettes  . Smokeless tobacco: Current User  . Alcohol Use: Yes     Comment: socially  . Drug Use: No  . Sexual Activity: Not on file   Other Topics Concern  . Not on file   Social History Narrative   Married, 3 children (11, 6, 3 yr).   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   Org from [place] area.   Tobacco: current as of 11/22/13, 1/4 ppd x 6535yrs.   Alcohol: social.  No hx of prob with alc or drug abuse in past.    Outpatient Encounter Prescriptions as of 11/22/2013  Medication Sig  . testosterone cypionate (DEPOTESTOTERONE CYPIONATE) 100 MG/ML injection 1 ml IM every 7 days  . [DISCONTINUED] chlorpheniramine-HYDROcodone (TUSSIONEX PENNKINETIC ER) 10-8 MG/5ML LQCR Take 5 mLs by mouth every 12 (twelve) hours as needed for cough.  . [DISCONTINUED] fluticasone (FLONASE) 50 MCG/ACT nasal spray Place 2 sprays into both nostrils daily.  . [DISCONTINUED] ibuprofen (ADVIL,MOTRIN) 600 MG tablet Take 1 tablet (600 mg total)  by mouth every 6 (six) hours as needed for pain.  . [DISCONTINUED] sertraline (ZOLOFT) 50 MG tablet Take 50 mg by mouth daily.   . [DISCONTINUED] TESTOSTERONE BU Place inside cheek.    Allergies  Allergen Reactions  . Morphine And Related Itching and Rash    Rash is localized. Itching is generalized    ROS Review of Systems  Constitutional: Positive for fatigue. Negative for fever, chills and appetite change.  HENT: Positive for dental problem (as per HPI). Negative for congestion, ear pain and sore throat.   Eyes: Negative for discharge, redness and visual disturbance.  Respiratory: Negative for cough, chest  tightness, shortness of breath and wheezing.   Cardiovascular: Negative for chest pain, palpitations and leg swelling.  Gastrointestinal: Negative for nausea, vomiting, abdominal pain, diarrhea and blood in stool.  Genitourinary: Negative for dysuria, urgency, frequency, hematuria, flank pain and difficulty urinating.  Musculoskeletal: Negative for arthralgias, joint swelling, myalgias and neck stiffness.  Skin: Negative for pallor and rash.  Neurological: Negative for dizziness, speech difficulty, weakness and headaches.  Hematological: Negative for adenopathy. Does not bruise/bleed easily.  Psychiatric/Behavioral: Negative for confusion. The patient is not nervous/anxious.      PE; Blood pressure 147/82, pulse 76, temperature 99 F (37.2 C), temperature source Temporal, resp. rate 18, height 5\' 11"  (1.803 m), weight 176 lb (79.833 kg), SpO2 98.00%. Gen: Alert, well appearing.  Patient is oriented to person, place, time, and situation. ZOX:WRUE: no injection, icteris, swelling, or exudate.  EOMI, PERRLA. Mouth: lips without lesion/swelling.  Oral mucosa pink and moist. Oropharynx without erythema, exudate, or swelling.  Neck - No masses or thyromegaly or limitation in range of motion CV: RRR, no m/r/g.   LUNGS: CTA bilat, nonlabored resps, good aeration in all lung fields. ABD: soft, NT, ND, BS normal.  No hepatospenomegaly or mass.  No bruits. EXT: no clubbing, cyanosis, or edema.  Genitals normal; both testes normal without tenderness, masses, hydroceles, varicoceles, erythema or swelling. Shaft normal, circumcised, meatus normal without discharge. No inguinal hernia noted. No inguinal lymphadenopathy.  Pertinent labs:  None available today  ASSESSMENT AND PLAN:   New pt: obtain old records.  Hypogonadism, male Improving on testosterone injections: 100mg  IM q7d. He reports that a f/u testost level 6 days after one of his earlier injections came back in normal range. He also says  he got appropriate pituitary labs and PSA prior to getting on testosterone.  No labs available for review today. I told him that I don't really know anything about his question of giving beta-HCG injections or vitamin B12 injections in cases like his, and I offered him an endocrinology referral and he happily accepted. I refilled his testosterone today (his wife gives him this injection at home q 7d.  An After Visit Summary was printed and given to the patient.  Return in about 6 months (around 05/22/2014) for f/u hypogonadism.

## 2013-11-22 NOTE — Assessment & Plan Note (Signed)
Improving on testosterone injections: 100mg  IM q7d. He reports that a f/u testost level 6 days after one of his earlier injections came back in normal range. He also says he got appropriate pituitary labs and PSA prior to getting on testosterone.  No labs available for review today. I told him that I don't really know anything about his question of giving beta-HCG injections or vitamin B12 injections in cases like his, and I offered him an endocrinology referral and he happily accepted. I refilled his testosterone today (his wife gives him this injection at home q 7d.

## 2013-11-22 NOTE — Progress Notes (Signed)
Pre visit review using our clinic review tool, if applicable. No additional management support is needed unless otherwise documented below in the visit note. 

## 2013-11-25 ENCOUNTER — Encounter: Payer: Self-pay | Admitting: *Deleted

## 2013-11-26 ENCOUNTER — Telehealth: Payer: Self-pay | Admitting: Family Medicine

## 2013-11-26 NOTE — Telephone Encounter (Signed)
Patient just picked up his testosterone and realized that he does not take 100mg , he takes the 200mg .  Can we write him a different rx?

## 2013-11-26 NOTE — Telephone Encounter (Signed)
Patient's wife aware.  Pharmacy at [facility] aware of new medication dose.

## 2013-11-26 NOTE — Telephone Encounter (Signed)
I recommend he inject 2 ml of the current testosterone preparation (100mg /ml) every 7d until his current supply runs out.  Pls call his pharmacy and ask if we can cancel the remaining RF's on that most recent rx.  Then I'll do a new rx for the correct preparation (200 mg/ml) and he can begin taking 1ml q7d of this.--thx

## 2013-12-05 ENCOUNTER — Encounter: Payer: Self-pay | Admitting: Internal Medicine

## 2013-12-05 ENCOUNTER — Ambulatory Visit (INDEPENDENT_AMBULATORY_CARE_PROVIDER_SITE_OTHER): Payer: BC Managed Care – PPO | Admitting: Internal Medicine

## 2013-12-05 VITALS — BP 122/68 | HR 82 | Temp 97.8°F | Resp 12 | Ht 70.0 in | Wt 179.9 lb

## 2013-12-05 DIAGNOSIS — E291 Testicular hypofunction: Secondary | ICD-10-CM

## 2013-12-05 DIAGNOSIS — R7989 Other specified abnormal findings of blood chemistry: Secondary | ICD-10-CM

## 2013-12-05 NOTE — Patient Instructions (Signed)
Please take 200 mg of testosterone on Sunday and come for labs on Wednesday at 8 am, on an empty stomach. We will get the records from your PCP and see what labs we need to check then. Please return in 3 months. Make sure the visit falls midway between your injections and is at 8 am so we can recheck your testosterone level.

## 2013-12-05 NOTE — Progress Notes (Signed)
Patient ID: Randall Abbott, male   DOB: 01/19/1981, 33 y.o.   MRN: [id number]  HPI: Randall Abbott is a 33 y.o.-year-old man, referred by his PCP, Randall Abbott, for evaluation and management of hypogonadism. He was see by Dr Kathrynn [facility]Manning [name]  He was dx with hypogonadism (? If primary or central) 6 mo ago: tired, low libido, problems obtaining and maintaining an erection. His total testosterone was ~200 per his recall. He has been initially suggested testosterone gel, however this has been too expensive, so he started on  testosterone cypionate - initially 200 mg q2 weeks, now 200 mg im weekly (100 mg every Sun and 100 mg every Wed). He noticed testicular shrinking after starting testosterone, and also breast discomfort, but not enlargement.   He admits for decreased libido before injections, now better, but not back to normal. Has difficulty obtaining and maintaining an erection, now better No trauma to testes, testicular irradiation or surgery No h/o of mumps orchitis/h/o autoimmune ds. No h/o cryptorchidism He grew and went through puberty like his peers No shrinking of testes. No very small testes (<5 ml) No incomplete/delayed sexual development     + breast discomfort - after starting testosterone inj/no gynecomastia     No loss of body hair (axillary/pubic)/maybe decreased need for shaving + height loss: 5'11-6' before, now 5'9"-5'10" + abnormal sense of smell - in the past 1-2 years, normal before No hot flushes No vision problems No worst HA of his life No FH of hypogonadism/infertility  No personal h/o infertility - has 3 children: 3512, 586, 383 y/o No FH of hemochromatosis or pituitary tumors No STD No excessive weight gain or loss.  No chronic diseases No chronic pain. Not on opiates, does not take steroids.  No more than 2 drinks a day of alcohol at a time, and this is rarely. Smokes 1/2 PPD. No anabolic steroids use No herbal medicines Not on antidepressants - stopped Sertraline  few years ago No AI ds in his family, no FH of MS.  He does not have family history of early cardiac disease.  He exercises 3x a week. Diet: - Breakfast: nothing - Lunch: sandwich or fast food - Dinner: meat + vegetables  ROS: Constitutional: no weight gain/loss, + decreased appetite, + fatigue, no subjective hyperthermia/hypothermia, + poor sleep Eyes: no blurry vision, no xerophthalmia ENT: no sore throat, no nodules palpated in throat, no dysphagia/odynophagia, no hoarseness; + decreased hearing, + tinnitus Cardiovascular: no CP/SOB/palpitations/leg swelling Respiratory: + cough/no SOB Gastrointestinal: no N/V/D/C Musculoskeletal: no muscle/joint aches Skin: no rashes, + hair loss Neurological: no tremors/numbness/tingling/dizziness Psychiatric: no depression/anxiety Also, see HPI  Past Medical History  Diagnosis Date  . History of mood disorder     adjustment d/o with depression features: no meds since about 2012  . GERD (gastroesophageal reflux disease)     diet controlled  . Hay fever   . Frequent headaches     subsided significantly when he got corrective lenses  . Hypogonadism male     presenting sx's: fatigue, no libido, ED, insomnia   History reviewed. No pertinent past surgical history. History   Social History  . Marital Status: Married    Spouse Name: N/A    Number of Children: 3   Social History Main Topics  . Smoking status: Current Every Day Smoker -- 0.50 packs/day    Types: Cigarettes  . Smokeless tobacco: Current User  . Alcohol Use: Yes     Comment: socially  . Drug  Use: No   Social History Narrative   Married, 3 children (11, 6, 3 yr).   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   Org from [place] area.   Tobacco: current as of 11/22/13, 1/4 ppd x 12yrs.   Alcohol: social.  No hx of prob with alc or drug abuse in past.   Allergies  Allergen Reactions  . Morphine And Related Itching and Rash    Rash is localized. Itching is  generalized   Family History  Problem Relation Age of Onset  . Hypertension Mother   . Cancer Maternal Grandfather    PE: BP 122/68  Pulse 82  Temp(Src) 97.8 F (36.6 C) (Oral)  Resp 12  Ht 5\' 10"  (1.778 m)  Wt 179 lb 14.4 oz (81.602 kg)  BMI 25.81 kg/m2  SpO2 97% Wt Readings from Last 3 Encounters:  12/05/13 179 lb 14.4 oz (81.602 kg)  11/22/13 176 lb (79.833 kg)  10/14/13 170 lb (77.111 kg)   Constitutional: normal weight, in NAD Eyes: PERRLA, EOMI, no exophthalmos ENT: moist mucous membranes, no thyromegaly, no cervical lymphadenopathy Cardiovascular: RRR, No MRG Respiratory: CTA B Gastrointestinal: abdomen soft, NT, ND, BS+ Musculoskeletal: no deformities, strength intact in all 4 Skin: moist, warm, no rashes, + multiple tatoos Neurological: no tremor with outstretched hands, DTR normal in all 4 Genital exam: normal male escutcheon, no inguinal LAD, normal phallus, testes ~40mL bilaterally, no testicular masses, no penile discharge.  No gynecomastia, but discomfort at palpation  ASSESSMENT: 1. Hypogonadism  PLAN:  1.  Pt with h/o hypogonadism, but unclear if primary or secondary. I do not have any Testosterone levels to review - he filled out a release of info form and will try to obtain the previous labs from PCP.  -  I explained that the total testosterone consists of bioavailable T and bound testosterone. The total testosterone can be low: - later in the afternoon  - when the testes do not secrete enough testosterone (in this case the free testosterone is low, too)   - when the SHBG is low (in this case the free testosterone is normal). In his case, the lower SHBG is most likely the reason for low total T.  - he is not sure if he had a low free T or if this was checked at all. - I would be very curious to find out why a healthy, non-anabolic steroid user, nondiabetic, nonobese, 33 y/o (at the time of dx) has a low testosterone, so we need to be aggressive with  investigating the cause for his hypogonadism - I will first obtain labs from previous PCP then check some more pituitary and hemochromatosis labs - I believe he takes too much testosterone: 100 mg every 3-4 days... He has SEs from this: noticed testicle shrinking and breast discomfort. I advised him to take 200 mg testosterone at his next dose and return midway b/w injections for a T level.  - we discussed about the cardiac risk of testosterone and the fact that we would aim for a level between the LLN and midnormal range. - RTC in 6 mo   After the patient left, I received the records from Dr. Aleatha Borer at [facility], from 09/19/2013 - which was a followup visit after 09/16/2013. He was mentioned the patient had a normal free testosterone at that time of 11.9 (8.7-25.1), however with a low total testosterone of [phone]). Unfortunately, I am not sure at what time of the day these labs  were drawn. I do not have records for other tests. Dr. Kathrynn Running mentioned in his note that he spend time discussing treatment options, monitoring, and adverse effects of testosterone, and he started him on 2 pumps Axiron daily.  Because the free testosterone was normal at the time of the blood draw, the patient does not have clear hypogonadism, and I do not completely agree with testosterone treatment in his case. Also, he has side effects from taking too much testosterone. I will discuss with the patient to slowly come off testosterone and retest in several months to see if he still needs the testosterone. I will schedule him to come back in 6 months off testosterone.  Called and discussed with the patient about the above issues, and I advised him to taper testosterone down until he can come off. I would like to repeat his testosterone level (total and free) 6 months after completely being off testosterone. If, at that time his free testosterone is again normal, then he does not have any indication for  restarting testosterone and he has to work on diet, exercise, cholesterol levels, sleep quality. If, in fact, he has low free testosterone at that time, we might need to wait another period of 6 months and repeat, since testosterone treatment can suppress the pituitary hormones for a few months to 2 years. I advised him to call me when he 6 months off testosterone, so I can order his 8 AM, fasting, testosterone level. I also advised him how to taper down the testosterone: Start taking 100 mg every week for a month, then switch to 100 mg every 2 weeks for another month, and then decrease by 50 mg until he can come off. Patient agrees with the plan

## 2013-12-11 ENCOUNTER — Telehealth: Payer: Self-pay | Admitting: Family Medicine

## 2013-12-11 NOTE — Telephone Encounter (Signed)
Relevant patient education assigned to patient using Emmi. ° °

## 2013-12-30 ENCOUNTER — Telehealth: Payer: Self-pay | Admitting: Family Medicine

## 2013-12-30 NOTE — Telephone Encounter (Signed)
I'm sorry but I have no idea what she is talking about. As far as I know, this patient does not have a chronic pain problem.  Pls call and clarify with pt WHO told him that they were going to do a referral and make sure they really mean pain mgmt and not some other type of referral.--thx

## 2013-12-30 NOTE — Telephone Encounter (Signed)
Wife is calling to inquire about the pain management referral that was supposed to be done for her husband, no referral has been placed for pain management please advise

## 2013-12-30 NOTE — Telephone Encounter (Signed)
Left message for wife to return my call.  

## 2014-03-06 ENCOUNTER — Ambulatory Visit: Payer: BC Managed Care – PPO | Admitting: Internal Medicine

## 2014-03-26 ENCOUNTER — Encounter: Payer: Self-pay | Admitting: Family Medicine

## 2014-03-26 ENCOUNTER — Ambulatory Visit (INDEPENDENT_AMBULATORY_CARE_PROVIDER_SITE_OTHER): Payer: BC Managed Care – PPO | Admitting: Family Medicine

## 2014-03-26 VITALS — BP 146/85 | HR 71 | Temp 99.0°F | Resp 18 | Ht 71.0 in | Wt 181.0 lb

## 2014-03-26 DIAGNOSIS — N410 Acute prostatitis: Secondary | ICD-10-CM

## 2014-03-26 DIAGNOSIS — R319 Hematuria, unspecified: Secondary | ICD-10-CM

## 2014-03-26 LAB — POCT URINALYSIS DIPSTICK
Bilirubin, UA: NEGATIVE
Glucose, UA: NEGATIVE
Ketones, UA: NEGATIVE
LEUKOCYTES UA: NEGATIVE
NITRITE UA: NEGATIVE
PH UA: 7.5
PROTEIN UA: NEGATIVE
Spec Grav, UA: 1.015
Urobilinogen, UA: 1

## 2014-03-26 MED ORDER — CIPROFLOXACIN HCL 750 MG PO TABS: 750.0000 mg | ORAL_TABLET | Freq: Two times a day (BID) | ORAL | Status: DC

## 2014-03-26 NOTE — Progress Notes (Signed)
Pre visit review using our clinic review tool, if applicable. No additional management support is needed unless otherwise documented below in the visit note. 

## 2014-03-26 NOTE — Progress Notes (Signed)
OFFICE NOTE  03/26/2014  CC:  Chief Complaint  Patient presents with  . Hematuria    today     HPI: Patient is a 33 y.o. white male who is here for "blood in toilet". First pee this morning saw lots of red pee, no clots noted.  No pain. No recent trauma to trunk/abdomen.  No recent extreme/prolonged exercise.  No hesitancy, frequency, urgency, or incomplete emptying.   Has never happened before.  He is monogamous, no recent new sexual partner. Has had some chronic LBP but lately having pain diffusely from CVA region bilat down to low back, L>R.  No change in chronic pain down legs.  No NSAIDs. Still doing some cigarette smoking.  Pertinent PMH:  Past medical, surgical, social, and family history reviewed: renal cancer and bladder cancer on mom's side of family.  MEDS:  Outpatient Prescriptions Prior to Visit  Medication Sig Dispense Refill  . testosterone cypionate (DEPOTESTOTERONE CYPIONATE) 100 MG/ML injection 200 mg once a week. Pt takes 100 mg 2x a week - on Sun and Wed       No facility-administered medications prior to visit.  Vicodin 5/325 prn pain (Dr. Oneal GroutGarvin, pain mgmt)  PE: Blood pressure 146/85, pulse 71, temperature 99 F (37.2 C), temperature source Temporal, resp. rate 18, height 5\' 11"  (1.803 m), weight 181 lb (82.101 kg), SpO2 98.00%. Gen: Alert, well appearing.  Patient is oriented to person, place, time, and situation. ZOX:WRUEENT:Eyes: no injection, icteris, swelling, or exudate.  EOMI, PERRLA. Mouth: lips without lesion/swelling.  Oral mucosa pink and moist. Oropharynx without erythema, exudate, or swelling.  CV: RRR, no m/r/g.   LUNGS: CTA bilat, nonlabored resps, good aeration in all lung fields. ABD: soft, ND, BS normal.  Mild diffuse abd TTP w/out guarding or rebound.  No HSM or mass. EXT: no clubbing, cyanosis, or edema.  DRE: prostate is diffusely tender to palpation, otherwise normal.  LAB:  CC UA showed large blood but o/w was normal  IMPRESSION AND  PLAN:  Acute prostatitis with hematuria: cipro 750mg  bid x 14d. Send urine for c/s.  An After Visit Summary was printed and given to the patient.  FOLLOW UP: 2-3 wks

## 2014-03-27 LAB — URINE CULTURE
Colony Count: NO GROWTH
Organism ID, Bacteria: NO GROWTH

## 2014-04-11 ENCOUNTER — Ambulatory Visit: Payer: BC Managed Care – PPO | Admitting: Family Medicine

## 2014-05-07 ENCOUNTER — Other Ambulatory Visit: Payer: Self-pay | Admitting: Family Medicine

## 2014-07-17 ENCOUNTER — Other Ambulatory Visit: Payer: Self-pay

## 2014-07-17 NOTE — Telephone Encounter (Signed)
Pharmacy requesting a refill on Testosterone. Med pending signature. Last ov 03/26/2014. Last RF 05/08/2014

## 2014-07-18 ENCOUNTER — Telehealth: Payer: Self-pay

## 2014-07-18 NOTE — Telephone Encounter (Signed)
Spoke with pt's wife, advised message from [place]. She states pt did;t follow up with Dr Elvera Lennox because she had a different approach and didn't want to give pt testosterone injections. She states the injections are helping him and would rather see Dr Milinda Cave. Please advise.

## 2014-07-18 NOTE — Telephone Encounter (Signed)
Pt was to return to Dr Charlean Sanfilippo ofc in April for testosterone levels. He did not f/u.  See her note from 11/2013: " Please return in 3 months. Make sure the visit falls midway between your injections and is at 8 am so we can recheck your testosterone level." He needs to f/u w/her or discuss w/Dr McGowen.

## 2014-07-18 NOTE — Telephone Encounter (Signed)
Wife left message requesting refill on Testosterone for pt. Please advise. Last OV 03/26/2014. Pt is completely out.

## 2014-07-19 NOTE — Telephone Encounter (Signed)
Sorry, I won't rx any testost for him.  I initially referred him to Dr. Gherghe b/c I was not comfortable treating him for this in the first place.  I strongly recommend he follow the recommendations of the specialist regarding this matter b/c she knows exactly what she is doing and is trying to do the safest, most sensible thing for the patient's problem.-thx  

## 2014-07-22 ENCOUNTER — Telehealth: Payer: Self-pay | Admitting: Family Medicine

## 2014-07-22 NOTE — Telephone Encounter (Signed)
Pt requesting rf of his testosterone injection.  Please advise.

## 2014-07-22 NOTE — Telephone Encounter (Signed)
Sorry, I won't rx any testost for him.  I initially referred him to Dr. Elvera Lennox b/c I was not comfortable treating him for this in the first place.  I strongly recommend he follow the recommendations of the specialist regarding this matter b/c she knows exactly what she is doing and is trying to do the safest, most sensible thing for the patient's problem.-thx

## 2014-07-22 NOTE — Telephone Encounter (Signed)
Denied RX request from pharmacy.

## 2014-10-27 ENCOUNTER — Encounter (HOSPITAL_BASED_OUTPATIENT_CLINIC_OR_DEPARTMENT_OTHER): Payer: Self-pay | Admitting: *Deleted

## 2014-10-27 ENCOUNTER — Emergency Department (HOSPITAL_BASED_OUTPATIENT_CLINIC_OR_DEPARTMENT_OTHER)
Admission: EM | Admit: 2014-10-27 | Discharge: 2014-10-27 | Disposition: A | Discharge status: Home / Self Care | Payer: BC Managed Care – PPO | Attending: Emergency Medicine | Admitting: Emergency Medicine

## 2014-10-27 DIAGNOSIS — Z8659 Personal history of other mental and behavioral disorders: Secondary | ICD-10-CM | POA: Insufficient documentation

## 2014-10-27 DIAGNOSIS — Z792 Long term (current) use of antibiotics: Secondary | ICD-10-CM | POA: Insufficient documentation

## 2014-10-27 DIAGNOSIS — Z79899 Other long term (current) drug therapy: Secondary | ICD-10-CM | POA: Diagnosis not present

## 2014-10-27 DIAGNOSIS — Z8719 Personal history of other diseases of the digestive system: Secondary | ICD-10-CM | POA: Insufficient documentation

## 2014-10-27 DIAGNOSIS — Z87828 Personal history of other (healed) physical injury and trauma: Secondary | ICD-10-CM | POA: Diagnosis not present

## 2014-10-27 DIAGNOSIS — E291 Testicular hypofunction: Secondary | ICD-10-CM | POA: Insufficient documentation

## 2014-10-27 DIAGNOSIS — M542 Cervicalgia: Secondary | ICD-10-CM | POA: Diagnosis present

## 2014-10-27 DIAGNOSIS — M5412 Radiculopathy, cervical region: Secondary | ICD-10-CM | POA: Diagnosis not present

## 2014-10-27 DIAGNOSIS — Z720 Tobacco use: Secondary | ICD-10-CM | POA: Diagnosis not present

## 2014-10-27 HISTORY — DX: Other intervertebral disc displacement, lumbar region: M51.26

## 2014-10-27 NOTE — ED Provider Notes (Addendum)
CSN: [id number]     Arrival date & time 10/27/14  0134 History   First MD Initiated Contact with Patient 10/27/14 0258     Chief Complaint  Patient presents with  . Neck Pain     (Consider location/radiation/quality/duration/timing/severity/associated sxs/prior Treatment) HPI  This is a 33 year old male with a history of lumbar disease. He is here because he turned his head in bed about 45 minutes prior to arrival and experienced the sudden onset of a sharp pain in his left neck radiating upward to behind his left ear and down to through his left arm to his left third fourth and fifth fingers. There is some paresthesias of the left third fourth and fifth fingers. Pain is worse with rotation or leaning of his head to the right. There is lesser exacerbation with movement of the left shoulder. Left shoulder range of motion is not limited and there is no weakness in his left upper extremity. He rates his pain about an 8 out of 10. He has hydrocodone at home for pain but has not taken anything yet.  Past Medical History  Diagnosis Date  . History of mood disorder     adjustment d/o with depression features: no meds since about 2012  . GERD (gastroesophageal reflux disease)     diet controlled  . Hay fever   . Frequent headaches     subsided significantly when he got corrective lenses  . Hypogonadism male     presenting sx's: fatigue, no libido, ED, insomnia  . Ruptured lumbar intervertebral disc    History reviewed. No pertinent past surgical history. Family History  Problem Relation Age of Onset  . Hypertension Mother   . Cancer Maternal Grandfather    History  Substance Use Topics  . Smoking status: Current Every Day Smoker -- 0.50 packs/day    Types: Cigarettes  . Smokeless tobacco: Current User  . Alcohol Use: Yes     Comment: socially    Review of Systems  All other systems reviewed and are negative.   Allergies  Morphine and related  Home Medications   Prior to  Admission medications   Medication Sig Start Date End Date Taking? Authorizing Provider  ciprofloxacin (CIPRO) 750 MG tablet Take 1 tablet (750 mg total) by mouth 2 (two) times daily. 03/26/14   Jeoffrey MassedPhilip H McGowen, MD  HYDROcodone-acetaminophen (NORCO/VICODIN) 5-325 MG per tablet Take 1 tablet by mouth every 6 (six) hours as needed for moderate pain.    Historical Provider, MD  testosterone cypionate (DEPOTESTOTERONE CYPIONATE) 100 MG/ML injection 200 mg once a week. Pt takes 100 mg 2x a week - on Sun and Wed 11/22/13   Jeoffrey MassedPhilip H McGowen, MD   BP 128/82 mmHg  Pulse 69  SpO2 99%   Physical Exam  General: Well-developed, well-nourished male in no acute distress; appearance consistent with age of record HENT: normocephalic; atraumatic Eyes: pupils equal, round and reactive to light; extraocular muscles intact Neck: supple; pain of chief complaint reproduced with rotation of his head to the right or with leaning of his head to the right; there is left sided soft tissue tenderness Heart: regular rate and rhythm Lungs: clear to auscultation bilaterally Abdomen: soft; nondistended Extremities: No deformity; full range of motion; pulses normal Neurologic: Awake, alert and oriented; motor function intact in all extremities and symmetric; no facial droop Skin: Warm and dry Psychiatric: Normal mood and affect    ED Course  Procedures (including critical care time)   MDM  History and  exam consistent with radiculopathy most likely C8. He will follow-up with his PCP, Dr. Marvel PlanMcGowan.    Hanley SeamenJohn L Trinitey Roache, MD 10/27/14 [phone]  Hanley SeamenJohn L Kelse Ploch, MD 10/27/14 [phone]

## 2014-10-27 NOTE — Discharge Instructions (Signed)

## 2014-10-27 NOTE — ED Notes (Signed)
C/o L neck pain, onset ~45 minutes ago, rolled over in bed and almost felt a pop, radiates down L arm, numbness and tingling in L arm/hand, worse with movement, hurts to turn head & abduct arms, no meds PTA, no h/o same, denies other sx, describes as sharp & burning, and stabbing with movement, rates 8/10, CMS intact, ROM limited with pain.

## 2015-08-19 ENCOUNTER — Emergency Department (HOSPITAL_BASED_OUTPATIENT_CLINIC_OR_DEPARTMENT_OTHER)
Admission: EM | Admit: 2015-08-19 | Discharge: 2015-08-19 | Disposition: A | Discharge status: Home / Self Care | Payer: BLUE CROSS/BLUE SHIELD | Attending: Emergency Medicine | Admitting: Emergency Medicine

## 2015-08-19 ENCOUNTER — Encounter (HOSPITAL_BASED_OUTPATIENT_CLINIC_OR_DEPARTMENT_OTHER): Payer: Self-pay | Admitting: Emergency Medicine

## 2015-08-19 DIAGNOSIS — G8929 Other chronic pain: Secondary | ICD-10-CM | POA: Diagnosis not present

## 2015-08-19 DIAGNOSIS — Z8719 Personal history of other diseases of the digestive system: Secondary | ICD-10-CM | POA: Insufficient documentation

## 2015-08-19 DIAGNOSIS — M79609 Pain in unspecified limb: Secondary | ICD-10-CM

## 2015-08-19 DIAGNOSIS — Z792 Long term (current) use of antibiotics: Secondary | ICD-10-CM | POA: Diagnosis not present

## 2015-08-19 DIAGNOSIS — G8918 Other acute postprocedural pain: Secondary | ICD-10-CM | POA: Diagnosis not present

## 2015-08-19 DIAGNOSIS — Z8639 Personal history of other endocrine, nutritional and metabolic disease: Secondary | ICD-10-CM | POA: Insufficient documentation

## 2015-08-19 DIAGNOSIS — Z720 Tobacco use: Secondary | ICD-10-CM | POA: Insufficient documentation

## 2015-08-19 DIAGNOSIS — Z8659 Personal history of other mental and behavioral disorders: Secondary | ICD-10-CM | POA: Diagnosis not present

## 2015-08-19 DIAGNOSIS — M79605 Pain in left leg: Secondary | ICD-10-CM | POA: Diagnosis present

## 2015-08-19 HISTORY — DX: Other chronic pain: G89.29

## 2015-08-19 HISTORY — DX: Dorsalgia, unspecified: M54.9

## 2015-08-19 MED ORDER — HYDROMORPHONE HCL 1 MG/ML IJ SOLN
2.0000 mg | Freq: Once | INTRAMUSCULAR | Status: AC
  Administered 2015-08-19: 2 mg via INTRAMUSCULAR
  Filled 2015-08-19: qty 2

## 2015-08-19 MED ORDER — HYDROMORPHONE HCL 4 MG PO TABS: 4.0000 mg | ORAL_TABLET | ORAL | Status: DC | PRN

## 2015-08-19 NOTE — ED Notes (Signed)
Pt states he was laying in bed when pain began in left leg. Pain began lower leg and has moved up towards groin. States it feels like its on fire. Is in pain magnangmet and received epidural injection yesterday. 9/10

## 2015-08-19 NOTE — ED Notes (Signed)
MD at bedside. 

## 2015-08-19 NOTE — ED Provider Notes (Signed)
CSN: [id number]     Arrival date & time 08/19/15  0222 History   None    Chief Complaint  Patient presents with  . Leg Pain     (Consider location/radiation/quality/duration/timing/severity/associated sxs/prior Treatment) HPI  This is a 34 year old male with a history of chronic pain. He had an epidural injection of L4 and 5 yesterday. He is here with a severe, burning pain in his left L4/L5 distribution that began about midnight and gradually worsened. He has never had a burning pain like this before. There is no motor or sensory deficit. There is no saddle anesthesia. There is no pallor of that leg. Pain is worse with movement of his leg at the left hip.  Past Medical History  Diagnosis Date  . History of mood disorder     adjustment d/o with depression features: no meds since about 2012  . GERD (gastroesophageal reflux disease)     diet controlled  . Hay fever   . Frequent headaches     subsided significantly when he got corrective lenses  . Hypogonadism male     presenting sx's: fatigue, no libido, ED, insomnia  . Ruptured lumbar intervertebral disc   . Chronic back pain    History reviewed. No pertinent past surgical history. Family History  Problem Relation Age of Onset  . Hypertension Mother   . Cancer Maternal Grandfather    Social History  Substance Use Topics  . Smoking status: Current Every Day Smoker -- 0.50 packs/day    Types: Cigarettes  . Smokeless tobacco: Current User  . Alcohol Use: Yes     Comment: socially    Review of Systems  All other systems reviewed and are negative.   Allergies  Morphine and related  Home Medications   Prior to Admission medications   Medication Sig Start Date End Date Taking? Authorizing Provider  HYDROcodone-acetaminophen (NORCO/VICODIN) 5-325 MG per tablet Take 1 tablet by mouth every 6 (six) hours as needed for moderate pain.   Yes Historical Provider, MD  pregabalin (LYRICA) 50 MG capsule Take 50 mg by mouth at  bedtime.   Yes Historical Provider, MD  ciprofloxacin (CIPRO) 750 MG tablet Take 1 tablet (750 mg total) by mouth 2 (two) times daily. 03/26/14   Jeoffrey MassedPhilip H McGowen, MD  testosterone cypionate (DEPOTESTOTERONE CYPIONATE) 100 MG/ML injection 200 mg once a week. Pt takes 100 mg 2x a week - on Sun and Wed 11/22/13   Jeoffrey MassedPhilip H McGowen, MD   BP 141/94 mmHg  Pulse 97  Temp(Src) 97.9 F (36.6 C) (Oral)  Resp 20  Ht 5\' 9"  (1.753 m)  Wt 165 lb (74.844 kg)  BMI 24.36 kg/m2  SpO2 99%   Physical Exam  General: Well-developed, well-nourished male in no acute distress; appearance consistent with age of record HENT: normocephalic; atraumatic Eyes: pupils equal, round and reactive to light; extraocular muscles intact Neck: supple Heart: regular rate and rhythm Lungs: Normal respiratory effort and excursion Abdomen: soft; nondistended Back: Pain on movement of back; positive straight leg raise on the left at 30 Extremities: No deformity; full range of motion; pulses normal; no pallor; no edema Neurologic: Awake, alert and oriented; motor function intact in all extremities and symmetric; sensation intact in lower extremities and symmetric; no facial droop Skin: Warm and dry Psychiatric: Normal mood and affect    ED Course  Procedures (including critical care time)   MDM  There is no evidence of acute neurosurgical emergency. We will treat his pain and he will  follow up with his pain management physician later today.   Paula Libra, MD 08/19/15 [phone]

## 2019-02-20 ENCOUNTER — Other Ambulatory Visit: Payer: Self-pay

## 2019-02-20 ENCOUNTER — Emergency Department (HOSPITAL_BASED_OUTPATIENT_CLINIC_OR_DEPARTMENT_OTHER)
Admission: EM | Admit: 2019-02-20 | Discharge: 2019-02-20 | Disposition: A | Discharge status: Home / Self Care | Payer: BLUE CROSS/BLUE SHIELD | Attending: Emergency Medicine | Admitting: Emergency Medicine

## 2019-02-20 ENCOUNTER — Encounter (HOSPITAL_BASED_OUTPATIENT_CLINIC_OR_DEPARTMENT_OTHER): Payer: Self-pay

## 2019-02-20 DIAGNOSIS — K529 Noninfective gastroenteritis and colitis, unspecified: Secondary | ICD-10-CM

## 2019-02-20 DIAGNOSIS — R112 Nausea with vomiting, unspecified: Secondary | ICD-10-CM

## 2019-02-20 DIAGNOSIS — F17210 Nicotine dependence, cigarettes, uncomplicated: Secondary | ICD-10-CM | POA: Diagnosis not present

## 2019-02-20 DIAGNOSIS — Z79899 Other long term (current) drug therapy: Secondary | ICD-10-CM | POA: Insufficient documentation

## 2019-02-20 DIAGNOSIS — R197 Diarrhea, unspecified: Secondary | ICD-10-CM | POA: Insufficient documentation

## 2019-02-20 LAB — COMPREHENSIVE METABOLIC PANEL
ALT: 20 U/L (ref 0–44)
AST: 21 U/L (ref 15–41)
Albumin: 5 g/dL (ref 3.5–5.0)
Alkaline Phosphatase: 77 U/L (ref 38–126)
Anion gap: 9 (ref 5–15)
BUN: 13 mg/dL (ref 6–20)
CO2: 27 mmol/L (ref 22–32)
Calcium: 9.8 mg/dL (ref 8.9–10.3)
Chloride: 102 mmol/L (ref 98–111)
Creatinine, Ser: 0.94 mg/dL (ref 0.61–1.24)
GFR calc Af Amer: >60 mL/min (ref >60)
GFR calc non Af Amer: >60 mL/min (ref >60)
Glucose, Bld: 113 mg/dL — ABNORMAL HIGH (ref 70–99)
Potassium: 3.2 mmol/L — ABNORMAL LOW (ref 3.5–5.1)
Sodium: 138 mmol/L (ref 135–145)
Total Bilirubin: 0.6 mg/dL (ref 0.3–1.2)
Total Protein: 8.6 g/dL — ABNORMAL HIGH (ref 6.5–8.1)

## 2019-02-20 LAB — CBC WITH DIFFERENTIAL/PLATELET
Abs Immature Granulocytes: 0.04 10*3/uL (ref 0.00–0.07)
Basophils Absolute: 0.1 10*3/uL (ref 0.0–0.1)
Basophils Relative: 0 %
Eosinophils Absolute: 0.3 10*3/uL (ref 0.0–0.5)
Eosinophils Relative: 2 %
HCT: 49 % (ref 39.0–52.0)
Hemoglobin: 15.8 g/dL (ref 13.0–17.0)
Immature Granulocytes: 0 %
Lymphocytes Relative: 13 %
Lymphs Abs: 1.6 10*3/uL (ref 0.7–4.0)
MCH: 28.7 pg (ref 26.0–34.0)
MCHC: 32.2 g/dL (ref 30.0–36.0)
MCV: 89.1 fL (ref 80.0–100.0)
Monocytes Absolute: 1 10*3/uL (ref 0.1–1.0)
Monocytes Relative: 8 %
Neutro Abs: 9.6 10*3/uL — ABNORMAL HIGH (ref 1.7–7.7)
Neutrophils Relative %: 77 %
Platelets: 308 10*3/uL (ref 150–400)
RBC: 5.5 MIL/uL (ref 4.22–5.81)
RDW: 12.4 % (ref 11.5–15.5)
WBC: 12.5 10*3/uL — ABNORMAL HIGH (ref 4.0–10.5)
nRBC: 0 % (ref 0.0–0.2)

## 2019-02-20 LAB — URINALYSIS, MICROSCOPIC (REFLEX)

## 2019-02-20 LAB — LIPASE, BLOOD: Lipase: 24 U/L (ref 11–51)

## 2019-02-20 LAB — URINALYSIS, ROUTINE W REFLEX MICROSCOPIC
Bilirubin Urine: NEGATIVE
Glucose, UA: NEGATIVE mg/dL
Ketones, ur: NEGATIVE mg/dL
Nitrite: NEGATIVE
Protein, ur: NEGATIVE mg/dL
Specific Gravity, Urine: 1.025 (ref 1.005–1.030)
pH: 6 (ref 5.0–8.0)

## 2019-02-20 MED ORDER — ONDANSETRON HCL 4 MG/2ML IJ SOLN
4.0000 mg | Freq: Once | INTRAMUSCULAR | Status: AC
  Administered 2019-02-20: 20:00:00 4 mg via INTRAVENOUS
  Filled 2019-02-20: qty 2

## 2019-02-20 MED ORDER — ONDANSETRON HCL 4 MG PO TABS: 4.0000 mg | ORAL_TABLET | Freq: Four times a day (QID) | ORAL | 0 refills | Status: AC | PRN

## 2019-02-20 MED ORDER — POTASSIUM CHLORIDE CRYS ER 20 MEQ PO TBCR
60.0000 meq | EXTENDED_RELEASE_TABLET | Freq: Once | ORAL | Status: AC
  Administered 2019-02-20: 21:00:00 60 meq via ORAL
  Filled 2019-02-20: qty 3

## 2019-02-20 MED ORDER — SODIUM CHLORIDE 0.9 % IV BOLUS
1000.0000 mL | Freq: Once | INTRAVENOUS | Status: AC
  Administered 2019-02-20: 1000 mL via INTRAVENOUS

## 2019-02-20 NOTE — ED Notes (Signed)
Late entry due to pt cares:  First contact with patient; self and role introduced.  No one at bedside with pt.  Pt placed on BP monitor for ongoing assessment.   N/V past 3 days, decreased appetite, only able to keep clears down.  Diarrhea started today, approx 2 watery episodes.  Last emesis this AM. Pt denies fever, chills.  No recent changes in diet, meds.  Denies pain.  VSS.   IV started to R AC. IVFs and meds per [facility]- pt tol all cares well. Took PO zofran at home, no relief. PO phenergan too sedating.  O u/o, normal. Pt aware UA needed still.  Given remote for TV. Bed is in low, locked position. Call light within reach.  All needs and concerns addressed prior to leaving room.

## 2019-02-20 NOTE — ED Triage Notes (Signed)
Pt c/o day 3 of n/v day 1 of diarrhea-denies fever-virtual PCP vsit today-rx phenergan with no relief-NAD-steady gait

## 2019-02-20 NOTE — Discharge Instructions (Signed)
Take Zofran every 6 hours as needed for nausea or vomiting.  Do not combine this with Phenergan.  Take 1 or the other.  Begin with clear liquids and progress your diet as tolerated.  Avoid any fried, greasy, fatty foods.  Please return to the emergency department if you develop any new or worsening symptoms including persistent fever over 100.4, intractable vomiting, localized abdominal pain, or any other new or concerning symptoms.

## 2019-02-20 NOTE — ED Provider Notes (Signed)
[facility] EMERGENCY DEPARTMENT Provider Note   CSN: [id number] Arrival date & time: 02/20/19  1940    History   Chief Complaint Chief Complaint  Patient presents with  . Emesis    HPI Randall Abbott is a [age] y.o. male with history of Suboxone use who presents with a 3-day history of nausea vomiting and 1 day history of diarrhea.  Both nonbloody.  Symptoms seem to be worse in the morning and after eating.  Patient denies any abdominal pain with this.  He denies any fever, chest pain, shortness of breath, back pain, urinary symptoms.  Patient reports he thought he may have had some bad chicken prior to this onset of symptoms, however his symptoms have persisted and he feels fatigued and dehydrated.  He has not had any recent travel or known sick contacts.  He has had 2 or 3 episodes of emesis daily and 1 or 2 episodes of diarrhea.  Patient had a telemedicine visit today and was prescribed Phenergan.  It made him sleepy and he has not vomited since, however he wanted to get checked out considering he is still feeling pretty fatigued and dehydrated.     HPI  Past Medical History:  Diagnosis Date  . Chronic back pain   . Frequent headaches    subsided significantly when he got corrective lenses  . GERD (gastroesophageal reflux disease)    diet controlled  . Hay fever   . History of mood disorder    adjustment d/o with depression features: no meds since about 2012  . Hypogonadism male    presenting sx's: fatigue, no libido, ED, insomnia  . Ruptured lumbar intervertebral disc     Patient Active Problem List   Diagnosis Date Noted  . Acute prostatitis with hematuria 03/26/2014  . Low serum testosterone level 11/22/2013    Past Surgical History:  Procedure Laterality Date  . BACK SURGERY          Home Medications    Prior to Admission medications   Medication Sig Start Date End Date Taking? Authorizing Provider  buprenorphine-naloxone (SUBOXONE) 2-0.5 mg SUBL  SL tablet Place 1 tablet under the tongue 2 (two) times daily.   Yes [provider]  testosterone cypionate (DEPOTESTOTERONE CYPIONATE) 100 MG/ML injection 200 mg once a week. Pt takes 100 mg 2x a week - on Sun and Wed 11/22/13  Yes McGowen, Maryjean MornPhilip H, MD  HYDROcodone-acetaminophen (NORCO/VICODIN) 5-325 MG per tablet Take 1 tablet by mouth every 6 (six) hours as needed for moderate pain.    [provider]  HYDROmorphone (DILAUDID) 4 MG tablet Take 1 tablet (4 mg total) by mouth every 4 (four) hours as needed for severe pain (for pain). 08/19/15   Molpus, John, MD  ondansetron (ZOFRAN) 4 MG tablet Take 1 tablet (4 mg total) by mouth every 6 (six) hours as needed for nausea or vomiting. 02/20/19   Reco Shonk, Waylan BogaAlexandra M, PA-C  pregabalin (LYRICA) 50 MG capsule Take 50 mg by mouth at bedtime.    [provider]    Family History Family History  Problem Relation Age of Onset  . Hypertension Mother   . Cancer Maternal Grandfather     Social History Social History   Tobacco Use  . Smoking status: Current Every Day Smoker    Packs/day: 0.50    Types: Cigarettes  . Smokeless tobacco: Never Used  Substance Use Topics  . Alcohol use: Not Currently  . Drug use: No  Allergies   Morphine and related   Review of Systems Review of Systems  Constitutional: Positive for appetite change. Negative for chills and fever.  HENT: Negative for facial swelling and sore throat.   Respiratory: Positive for cough (allergies). Negative for shortness of breath.   Cardiovascular: Negative for chest pain.  Gastrointestinal: Positive for diarrhea and vomiting. Negative for abdominal pain, blood in stool and nausea.  Genitourinary: Negative for dysuria.  Musculoskeletal: Negative for back pain.  Skin: Negative for rash and wound.  Neurological: Negative for headaches.  Psychiatric/Behavioral: The patient is not nervous/anxious.      Physical Exam Updated Vital Signs BP (!)  139/92   Pulse 74   Temp 98.4 F (36.9 C) (Oral)   Resp 16   Ht  (1.753 m)   Wt 67.1 kg   SpO2 100%   BMI 21.86 kg/m   Physical Exam Vitals signs and nursing note reviewed.  Constitutional:      General: He is not in acute distress.    Appearance: He is well-developed. He is not ill-appearing, toxic-appearing or diaphoretic.  HENT:     Head: Normocephalic and atraumatic.     Mouth/Throat:     Pharynx: No oropharyngeal exudate.     Comments: Mildly dry Eyes:     General: No scleral icterus.       Right eye: No discharge.        Left eye: No discharge.     Conjunctiva/sclera: Conjunctivae normal.     Pupils: Pupils are equal, round, and reactive to light.  Neck:     Musculoskeletal: Normal range of motion and neck supple.     Thyroid: No thyromegaly.  Cardiovascular:     Rate and Rhythm: Normal rate and regular rhythm.     Heart sounds: Normal heart sounds. No murmur. No friction rub. No gallop.   Pulmonary:     Effort: Pulmonary effort is normal. No respiratory distress.     Breath sounds: No stridor. Wheezing (few, expiratory) present. No rales.  Abdominal:     General: Bowel sounds are normal. There is no distension.     Palpations: Abdomen is soft.     Tenderness: There is no abdominal tenderness. There is no right CVA tenderness, left CVA tenderness, guarding or rebound.  Lymphadenopathy:     Cervical: No cervical adenopathy.  Skin:    General: Skin is warm and dry.     Coloration: Skin is not pale.     Findings: No rash.  Neurological:     Mental Status: He is alert.     Coordination: Coordination normal.      ED Treatments / Results  Labs (all labs ordered are listed, but only abnormal results are displayed) Labs Reviewed  COMPREHENSIVE METABOLIC PANEL - Abnormal; Notable for the following components:      Result Value   Potassium 3.2 (*)    Glucose, Bld 113 (*)    Total Protein 8.6 (*)    All other components within normal limits  CBC WITH  DIFFERENTIAL/PLATELET - Abnormal; Notable for the following components:   WBC 12.5 (*)    Neutro Abs 9.6 (*)    All other components within normal limits  URINALYSIS, ROUTINE W REFLEX MICROSCOPIC - Abnormal; Notable for the following components:   Hgb urine dipstick TRACE (*)    Leukocytes,Ua TRACE (*)    All other components within normal limits  URINALYSIS, MICROSCOPIC (REFLEX) - Abnormal; Notable for the following components:   Bacteria,  UA RARE (*)    All other components within normal limits  LIPASE, BLOOD    EKG None  Radiology No results found.  Procedures Procedures (including critical care time)  Medications Ordered in ED Medications  sodium chloride 0.9 % bolus 1,000 mL ( Intravenous Stopped 02/20/19 2111)  ondansetron (ZOFRAN) injection 4 mg (4 mg Intravenous Given 02/20/19 2012)  potassium chloride SA (K-DUR,KLOR-CON) CR tablet 60 mEq (60 mEq Oral Given 02/20/19 2112)     Initial Impression / Assessment and Plan / ED Course  I have reviewed the triage vital signs and the nursing notes.  Pertinent labs & imaging results that were available during my care of the patient were reviewed by me and considered in my medical decision making (see chart for details).        Patient with suspected viral gastroenteritis.  There is no abdominal pain or tenderness.  No indication for imaging at this time.  Labs are unremarkable except for potassium 3.2, which was replaced in the ED, and mild leukocytosis at 12.5.  Suspect viral etiology.  Patient is feeling much better after IV fluids, Zofran in the ED.  He is tolerating oral fluids.  He will be discharged home with Zofran instead of Phenergan.  Specific return precautions discussed including localized abdominal pain, intractable vomiting, fever.  He understands and agrees with plan.  Patient vital stable throughout ED course and discharged in satisfactory condition.  Final Clinical Impressions(s) / ED Diagnoses   Final  diagnoses:  Nausea vomiting and diarrhea  Gastroenteritis    ED Discharge Orders         Ordered    ondansetron (ZOFRAN) 4 MG tablet  Every 6 hours PRN     02/20/19 2145           Emi Holes, PA-C 02/20/19 2147    Arby Barrette, MD 02/23/19 1904

## 2019-07-26 ENCOUNTER — Emergency Department (HOSPITAL_BASED_OUTPATIENT_CLINIC_OR_DEPARTMENT_OTHER): Payer: BC Managed Care – PPO

## 2019-07-26 ENCOUNTER — Other Ambulatory Visit: Payer: Self-pay

## 2019-07-26 ENCOUNTER — Emergency Department (HOSPITAL_BASED_OUTPATIENT_CLINIC_OR_DEPARTMENT_OTHER)
Admission: EM | Admit: 2019-07-26 | Discharge: 2019-07-26 | Disposition: A | Discharge status: Home / Self Care | Payer: BC Managed Care – PPO | Attending: Emergency Medicine | Admitting: Emergency Medicine

## 2019-07-26 ENCOUNTER — Encounter (HOSPITAL_BASED_OUTPATIENT_CLINIC_OR_DEPARTMENT_OTHER): Payer: Self-pay | Admitting: Emergency Medicine

## 2019-07-26 DIAGNOSIS — F17210 Nicotine dependence, cigarettes, uncomplicated: Secondary | ICD-10-CM | POA: Insufficient documentation

## 2019-07-26 DIAGNOSIS — N23 Unspecified renal colic: Secondary | ICD-10-CM | POA: Diagnosis not present

## 2019-07-26 DIAGNOSIS — Z79899 Other long term (current) drug therapy: Secondary | ICD-10-CM | POA: Insufficient documentation

## 2019-07-26 DIAGNOSIS — R109 Unspecified abdominal pain: Secondary | ICD-10-CM | POA: Diagnosis present

## 2019-07-26 DIAGNOSIS — F1210 Cannabis abuse, uncomplicated: Secondary | ICD-10-CM | POA: Insufficient documentation

## 2019-07-26 LAB — URINALYSIS, MICROSCOPIC (REFLEX)

## 2019-07-26 LAB — URINALYSIS, ROUTINE W REFLEX MICROSCOPIC
Bilirubin Urine: NEGATIVE
Glucose, UA: NEGATIVE mg/dL
Ketones, ur: NEGATIVE mg/dL
Leukocytes,Ua: NEGATIVE
Nitrite: NEGATIVE
Protein, ur: NEGATIVE mg/dL
Specific Gravity, Urine: 1.01 (ref 1.005–1.030)
pH: >9 — ABNORMAL HIGH (ref 5.0–8.0)

## 2019-07-26 MED ORDER — ONDANSETRON HCL 4 MG/2ML IJ SOLN
4.0000 mg | Freq: Once | INTRAMUSCULAR | Status: AC
  Administered 2019-07-26: 4 mg via INTRAVENOUS
  Filled 2019-07-26: qty 2

## 2019-07-26 MED ORDER — FENTANYL CITRATE (PF) 100 MCG/2ML IJ SOLN
100.0000 ug | Freq: Once | INTRAMUSCULAR | Status: AC
  Administered 2019-07-26: 100 ug via INTRAVENOUS
  Filled 2019-07-26: qty 2

## 2019-07-26 MED ORDER — FENTANYL CITRATE (PF) 100 MCG/2ML IJ SOLN: 100.0000 ug | INTRAMUSCULAR | Status: DC | PRN

## 2019-07-26 NOTE — ED Triage Notes (Signed)
Pt states he started having pain in his right side about 20 to 30 minutes ago  States he broke out into a cold sweat  Denies pain in his back at this time  No nausea or vomiting at this time

## 2019-07-26 NOTE — ED Provider Notes (Signed)
[facility] Provider Note: Randall DellJ. Lane Cap Massi, MD, FACEP  CSN: [id number] MRN: [id number] ARRIVAL: 07/26/19 at 0144 ROOM: [facility]09/[facility]09   CHIEF COMPLAINT  Abdominal Pain   HISTORY OF PRESENT ILLNESS  07/26/19 1:59 AM Randall Abbott is a 38 y.o. male with 30 minutes of right lower quadrant abdominal pain with some involvement of the right flank.  He has had hematuria for several days prior to the onset of pain.  He has had associated diaphoresis and nausea but no vomiting.  Nothing makes the pain better or worse.  He describes the pain as sharp and stabbing and rates it a 10 out of 10.   Past Medical History:  Diagnosis Date   Chronic back pain    Frequent headaches    subsided significantly when he got corrective lenses   GERD (gastroesophageal reflux disease)    diet controlled   Hay fever    History of mood disorder    adjustment d/o with depression features: no meds since about 2012   Hypogonadism male    presenting sx's: fatigue, no libido, ED, insomnia   Ruptured lumbar intervertebral disc     Past Surgical History:  Procedure Laterality Date   BACK SURGERY      Family History  Problem Relation Age of Onset   Hypertension Mother    Cancer Maternal Grandfather     Social History   Tobacco Use   Smoking status: Current Every Day Smoker    Packs/day: 0.50    Types: Cigarettes   Smokeless tobacco: Never Used  Substance Use Topics   Alcohol use: Not Currently   Drug use: Yes    Types: Marijuana    Prior to Admission medications   Medication Sig Start Date End Date Taking? Authorizing Provider  buprenorphine-naloxone (SUBOXONE) 2-0.5 mg SUBL SL tablet Place 1 tablet under the tongue 2 (two) times daily.    [provider]  ondansetron (ZOFRAN) 4 MG tablet Take 1 tablet (4 mg total) by mouth every 6 (six) hours as needed for nausea or vomiting. 02/20/19   Law, Waylan BogaAlexandra M, PA-C  pregabalin (LYRICA) 50 MG capsule Take 50 mg by mouth  at bedtime.    [provider]  testosterone cypionate (DEPOTESTOTERONE CYPIONATE) 100 MG/ML injection 200 mg once a week. Pt takes 100 mg 2x a week - on Sun and Wed 11/22/13   Jeoffrey MassedMcGowen, Philip H, MD    Allergies Morphine and related   REVIEW OF SYSTEMS  Negative except as noted here or in the History of Present Illness.   PHYSICAL EXAMINATION  Initial Vital Signs Blood pressure (!) 153/88, pulse (!) 55, temperature 97.6 F (36.4 C), temperature source Oral, resp. rate 20, height 5\' 9"  (1.753 m), weight 68 kg, SpO2 100 %.  Examination General: Well-developed, well-nourished male in no acute distress; appearance consistent with age of record HENT: normocephalic; atraumatic Eyes: Normal appearance Neck: supple Heart: regular rate and rhythm Lungs: clear to auscultation bilaterally Abdomen: soft; nondistended; mild right lower quadrant tenderness; no masses or hepatosplenomegaly; bowel sounds present GU: No CVA tenderness Extremities: No deformity; full range of motion; pulses normal Neurologic: Awake, alert and oriented; motor function intact in all extremities and symmetric; no facial droop Skin: Diaphoretic Psychiatric: Grimacing   RESULTS  Summary of this visit's results, reviewed by myself:   EKG Interpretation  Date/Time:    Ventricular Rate:    PR Interval:    QRS Duration:   QT Interval:    QTC Calculation:  R Axis:     Text Interpretation:        Laboratory Studies: Results for orders placed or performed during the hospital encounter of 07/26/19 (from the past 24 hour(s))  Urinalysis, Routine w reflex microscopic     Status: Abnormal   Collection Time: 07/26/19  2:00 AM  Result Value Ref Range   Color, Urine YELLOW YELLOW   APPearance CLOUDY (A) CLEAR   Specific Gravity, Urine 1.010 1.005 - 1.030   pH >9.0 (H) 5.0 - 8.0   Glucose, UA NEGATIVE NEGATIVE mg/dL   Hgb urine dipstick MODERATE (A) NEGATIVE   Bilirubin Urine NEGATIVE NEGATIVE    Ketones, ur NEGATIVE NEGATIVE mg/dL   Protein, ur NEGATIVE NEGATIVE mg/dL   Nitrite NEGATIVE NEGATIVE   Leukocytes,Ua NEGATIVE NEGATIVE  Urinalysis, Microscopic (reflex)     Status: Abnormal   Collection Time: 07/26/19  2:00 AM  Result Value Ref Range   RBC / HPF 0-5 0 - 5 RBC/hpf   WBC, UA 0-5 0 - 5 WBC/hpf   Bacteria, UA RARE (A) NONE SEEN   Squamous Epithelial / LPF 0-5 0 - 5   Amorphous Crystal PRESENT    Imaging Studies: Ct Renal Stone Study  Result Date: 07/26/2019 CLINICAL DATA:  Right abdominal pain for 1 hour, hematuria EXAM: CT ABDOMEN AND PELVIS WITHOUT CONTRAST TECHNIQUE: Multidetector CT imaging of the abdomen and pelvis was performed following the standard protocol without IV contrast. COMPARISON:  None. FINDINGS: Lower chest: Lung bases are clear. Normal heart size. No pericardial effusion. Hepatobiliary: No focal liver abnormality is seen. No gallstones, gallbladder wall thickening, or biliary dilatation. Pancreas: Unremarkable. No pancreatic ductal dilatation or surrounding inflammatory changes. Spleen: Normal in size without focal abnormality. Small accessory splenule. Adrenals/Urinary Tract: Normal adrenal glands. No visible or contour deforming renal lesions. Mild asymmetric right hydroureteronephrosis with faint right perinephric stranding. No visible obstructing calculus or other urolithiasis is visualized. Mild circumferential bladder wall thickening is present though the bladder is under distended. No bladder calculi or visible urethral calculus. Stomach/Bowel: Distal esophagus is unremarkable. Stomach is markedly distended with ingested material duodenal sweep proceed along a normal course. No small bowel dilatation or wall thickening. Some fecalization of the distal small bowel contents is noted. No bowel obstruction. A normal appendix is visualized. No colonic dilatation or wall thickening. Vascular/Lymphatic: Atherosclerotic calcifications are seen within the iliac  arteries. No abdominal aortic aneurysm or ectasia. No suspicious or enlarged lymph nodes in the included lymphatic chains. Reproductive: The prostate and seminal vesicles are unremarkable. Visible portions of the external genitalia are free of acute abnormality. Other: No abdominopelvic free fluid or free gas. No bowel containing hernias. Musculoskeletal: No acute osseous abnormality or suspicious osseous lesion. L4-5 anterior lumbar spinal fusion cage is noted without acute complication. IMPRESSION: 1. Mild asymmetric right hydroureteronephrosis with faint right perinephric stranding. No visible obstructing calculus or other urolithiasis is visualized. Findings could reflect recently passed stone or ascending infection. 2. Mild circumferential bladder wall thickening is present though the bladder is under distended. Correlate with symptoms and urinalysis results to exclude cystitis. 3. Marked gastric distention with a ingested material. 4. Fecalization of the distal small bowel contents, correlate for slowed intestinal transit. 5. Prior L4-5 anterior spinal fusion. 6. Aortic Atherosclerosis (ICD10-I70.0). Electronically Signed   By: Lovena Le M.D.   On: 07/26/2019 02:39    ED COURSE and MDM  Nursing notes and initial vitals signs, including pulse oximetry, reviewed.  Vitals:   07/26/19 0151 07/26/19 0158  BP:  (!) 153/88  Pulse:  (!) 55  Resp:  20  Temp:  97.6 F (36.4 C)  TempSrc:  Oral  SpO2:  100%  Weight: 68 kg   Height: 5\' 9"  (1.753 m)    5:02 AM Patient pain-free after initial dose of fentanyl.  I suspect a passed stone given CT findings.  His urinalysis is negative for urinary tract infection.  PROCEDURES    ED DIAGNOSES     ICD-10-CM   1. Ureteral colic  N23        Laikyn Gewirtz, MD 07/26/19 [id number]

## 2019-08-02 MED FILL — ADDERALL XR 30 MG CAP SA: 30 | 30 days supply | Qty: 30 | Fill #0

## 2019-12-22 ENCOUNTER — Encounter (HOSPITAL_BASED_OUTPATIENT_CLINIC_OR_DEPARTMENT_OTHER): Payer: Self-pay | Admitting: Emergency Medicine

## 2019-12-22 ENCOUNTER — Emergency Department (HOSPITAL_BASED_OUTPATIENT_CLINIC_OR_DEPARTMENT_OTHER)
Admission: EM | Admit: 2019-12-22 | Discharge: 2019-12-22 | Disposition: A | Discharge status: Home / Self Care | Payer: BC Managed Care – PPO | Attending: Emergency Medicine | Admitting: Emergency Medicine

## 2019-12-22 ENCOUNTER — Other Ambulatory Visit: Payer: Self-pay

## 2019-12-22 DIAGNOSIS — R110 Nausea: Secondary | ICD-10-CM

## 2019-12-22 DIAGNOSIS — M7918 Myalgia, other site: Secondary | ICD-10-CM | POA: Insufficient documentation

## 2019-12-22 DIAGNOSIS — Z79899 Other long term (current) drug therapy: Secondary | ICD-10-CM | POA: Insufficient documentation

## 2019-12-22 DIAGNOSIS — R251 Tremor, unspecified: Secondary | ICD-10-CM | POA: Insufficient documentation

## 2019-12-22 DIAGNOSIS — F17210 Nicotine dependence, cigarettes, uncomplicated: Secondary | ICD-10-CM | POA: Insufficient documentation

## 2019-12-22 DIAGNOSIS — R52 Pain, unspecified: Secondary | ICD-10-CM

## 2019-12-22 MED ORDER — HYDROXYZINE HCL 25 MG PO TABS: 25.0000 mg | ORAL_TABLET | Freq: Four times a day (QID) | ORAL | 0 refills | Status: AC | PRN

## 2019-12-22 MED ORDER — DICYCLOMINE HCL 20 MG PO TABS: 20.0000 mg | ORAL_TABLET | Freq: Two times a day (BID) | ORAL | 0 refills | Status: AC | PRN

## 2019-12-22 MED ORDER — ONDANSETRON 4 MG PO TBDP: 4.0000 mg | ORAL_TABLET | Freq: Three times a day (TID) | ORAL | 0 refills | Status: AC | PRN

## 2019-12-22 MED ORDER — METHOCARBAMOL 500 MG PO TABS: 500.0000 mg | ORAL_TABLET | Freq: Two times a day (BID) | ORAL | 0 refills | Status: AC

## 2019-12-22 NOTE — ED Provider Notes (Signed)
[facility] EM[facility]Y DEPARTMENT Provider Note   CSN: [id number] Arrival date & time: 12/22/19  1606     History Chief Complaint  Patient presents with  . Medication withdrawal    Randall Abbott is a 39 y.o. male.  HPI      Randall Abbott is a 39 y.o. male, with a history of chronic back pain, GERD, presenting to the ED requesting dose of Suboxone.  He states, "I was supposed to pick up my new prescription on Thursday, but then was unable to because of other responsibilities and because of the weather."  His last dose of Suboxone was Thursday, February 11. His prescription was written in January, however, he did not pick it up after it was written because, "I was just using some I had left over from a previous prescription." He complains of nausea, muscle cramping, sweating, abdominal cramps, and shakiness.  Denies fever/chills, chest pain, shortness of breath, cough, syncope, vomiting, diarrhea, seizures, or any other complaints.   Past Medical History:  Diagnosis Date  . Chronic back pain   . Frequent headaches    subsided significantly when he got corrective lenses  . GERD (gastroesophageal reflux disease)    diet controlled  . Hay fever   . History of mood disorder    adjustment d/o with depression features: no meds since about 2012  . Hypogonadism male    presenting sx's: fatigue, no libido, ED, insomnia  . Ruptured lumbar intervertebral disc     Patient Active Problem List   Diagnosis Date Noted  . Acute prostatitis with hematuria 03/26/2014  . Low serum testosterone level 11/22/2013    Past Surgical History:  Procedure Laterality Date  . BACK SURGERY         Family History  Problem Relation Age of Onset  . Hypertension Mother   . Cancer Maternal Grandfather     Social History   Tobacco Use  . Smoking status: Current Every Day Smoker    Packs/day: 0.50    Types: Cigarettes  . Smokeless tobacco: Never Used  Substance Use Topics  .  Alcohol use: Not Currently  . Drug use: Yes    Types: Marijuana    Home Medications Prior to Admission medications   Medication Sig Start Date End Date Taking? Authorizing Provider  buprenorphine-naloxone (SUBOXONE) 2-0.5 mg SUBL SL tablet Place 1 tablet under the tongue 2 (two) times daily.    [provider]  dicyclomine (BENTYL) 20 MG tablet Take 1 tablet (20 mg total) by mouth 2 (two) times daily as needed for spasms (or abdominal cramping). 12/22/19   Shalita Notte C, PA-C  hydrOXYzine (ATARAX/VISTARIL) 25 MG tablet Take 1 tablet (25 mg total) by mouth every 6 (six) hours as needed for anxiety or itching. 12/22/19   Kenna Kirn C, PA-C  methocarbamol (ROBAXIN) 500 MG tablet Take 1 tablet (500 mg total) by mouth 2 (two) times daily. 12/22/19   Bridger Pizzi C, PA-C  ondansetron (ZOFRAN ODT) 4 MG disintegrating tablet Take 1 tablet (4 mg total) by mouth every 8 (eight) hours as needed for nausea or vomiting. 12/22/19   Raza Bayless C, PA-C  ondansetron (ZOFRAN) 4 MG tablet Take 1 tablet (4 mg total) by mouth every 6 (six) hours as needed for nausea or vomiting. 02/20/19   Law, Bea Graff, PA-C  pregabalin (LYRICA) 50 MG capsule Take 50 mg by mouth at bedtime.    [provider]  testosterone cypionate (DEPOTESTOTERONE CYPIONATE) 100 MG/ML injection  200 mg once a week. Pt takes 100 mg 2x a week - on Sun and Wed 11/22/13   Jeoffrey Massed, MD    Allergies    Morphine and related  Review of Systems   Review of Systems  Constitutional: Negative for chills and fever.  Respiratory: Negative for cough and shortness of breath.   Cardiovascular: Negative for chest pain.  Gastrointestinal: Positive for nausea. Negative for diarrhea and vomiting.  Musculoskeletal: Positive for myalgias.  Neurological: Negative for dizziness and syncope.  All other systems reviewed and are negative.   Physical Exam Updated Vital Signs BP (!) 125/97   Pulse (!) 120   Temp 98.7 F (37.1 C) (Oral)    Resp (!) 24   SpO2 100%   Physical Exam Vitals and nursing note reviewed.  Constitutional:      General: He is not in acute distress.    Appearance: He is well-developed. He is not diaphoretic.  HENT:     Head: Normocephalic and atraumatic.     Mouth/Throat:     Mouth: Mucous membranes are moist.     Pharynx: Oropharynx is clear.  Eyes:     Conjunctiva/sclera: Conjunctivae normal.  Cardiovascular:     Rate and Rhythm: Normal rate and regular rhythm.     Pulses: Normal pulses.          Radial pulses are 2+ on the right side and 2+ on the left side.     Comments: Tactile temperature in the extremities appropriate and equal bilaterally. Pulmonary:     Effort: Pulmonary effort is normal. No respiratory distress.     Breath sounds: Normal breath sounds.  Abdominal:     Palpations: Abdomen is soft.     Tenderness: There is no abdominal tenderness. There is no guarding.  Musculoskeletal:     Cervical back: Neck supple.  Lymphadenopathy:     Cervical: No cervical adenopathy.  Skin:    General: Skin is warm and dry.  Neurological:     Mental Status: He is alert.     Comments: No noted acute cognitive deficit. No noted tremor. Sensation grossly intact to light touch in the extremities.   Grip strengths equal bilaterally.   Strength 5/5 in all extremities.  No gait disturbance.  Coordination intact.  Handles oral secretions without noted difficulty.  No noted phonation or speech deficit.  Psychiatric:        Mood and Affect: Mood and affect normal.        Speech: Speech normal.        Behavior: Behavior normal.     ED Results / Procedures / Treatments   Labs (all labs ordered are listed, but only abnormal results are displayed) Labs Reviewed - No data to display  EKG None  Radiology No results found.  Procedures Procedures (including critical care time)  Medications Ordered in ED Medications - No data to display  ED Course  I have reviewed the triage vital  signs and the nursing notes.  Pertinent labs & imaging results that were available during my care of the patient were reviewed by me and considered in my medical decision making (see chart for details).  Clinical Course as of Dec 21 1729  Sun Dec 22, 2019  1640 Pulse rate manually calculated to be 88.  Pulse Rate(!): 120 [SJ]    Clinical Course User Index [SJ] Tenika Keeran, Hillard Danker, PA-C   MDM Rules/Calculators/A&P  Patient presents with several days without his Suboxone.  He endorses symptoms that he states are from withdrawal.  Patient is nontoxic-appearing and exam does not suggest severe or high risk withdrawal. My plan for the patient would have included giving him a dose of Suboxone here in the ED, however, this medication is not available at this site.  Therefore, I will prescribe medications to help with some of the patient's symptoms until he can pick up his Suboxone prescription tomorrow morning. The patient was given instructions for home care as well as return precautions. Patient voices understanding of these instructions, accepts the plan, and is comfortable with discharge.  Findings and plan of care discussed with Melene Plan, DO.  Vitals:   12/22/19 1620 12/22/19 1704  BP: (!) 125/97 (!) 139/96  Pulse: (!) 120 87  Resp: (!) 24 20  Temp: 98.7 F (37.1 C)   TempSrc: Oral   SpO2: 100% 100%     Final Clinical Impression(s) / ED Diagnoses Final diagnoses:  Nausea  Tremor  Body aches    Rx / DC Orders ED Discharge Orders         Ordered    methocarbamol (ROBAXIN) 500 MG tablet  2 times daily     12/22/19 1707    ondansetron (ZOFRAN ODT) 4 MG disintegrating tablet  Every 8 hours PRN     12/22/19 1707    hydrOXYzine (ATARAX/VISTARIL) 25 MG tablet  Every 6 hours PRN     12/22/19 1707    dicyclomine (BENTYL) 20 MG tablet  2 times daily PRN     12/22/19 1707           Anselm Pancoast, PA-C 12/22/19 1732    Melene Plan, DO 12/22/19 1739

## 2019-12-22 NOTE — ED Triage Notes (Signed)
Pt here with withdrawal sx from pain meds that he was unable to get refilled. States he had back surgery 4 years ago. The refill is at his pharmacy but his pharmacy is closed today. Chills, sweats, unable to sleep and generalized body pain.

## 2019-12-22 NOTE — ED Notes (Signed)
Randall Abbott ( Wife) [phone]

## 2019-12-22 NOTE — Discharge Instructions (Addendum)
Below is a regimen to help with symptoms associated with missing doses of medications like Suboxone.  Dicyclomine: Dicyclomine (generic for Bentyl) is what is known as an antispasmodic and is intended to help reduce abdominal discomfort. Methocarbamol: Methocarbamol (generic for Robaxin) is a muscle relaxer and can help relieve stiff muscles or muscle spasms.  Do not drive or perform other dangerous activities while taking this medication as it can cause drowsiness as well as changes in reaction time and judgement. Nausea/vomiting: Use the ondansetron (generic for Zofran) for nausea or vomiting.  This medication may not prevent all vomiting or nausea, but can help facilitate better hydration. Things that can help with nausea/vomiting also include peppermint/menthol candies, vitamin B12, and ginger. Diarrhea: May use medications such as loperamide (Imodium) or Bismuth subsalicylate (Pepto-Bismol). Hydroxyzine: May use the hydroxyzine, as needed, for itching.  Use caution as hydroxyzine can make you drowsy. Antiinflammatory medications: Take 600 mg of ibuprofen every 6 hours or 440 mg (over the counter dose) to 500 mg (prescription dose) of naproxen every 12 hours for the next 3 days. After this time, these medications may be used as needed for pain. Take these medications with food to avoid upset stomach. Choose only one of these medications, do not take them together. Acetaminophen (generic for Tylenol): Should you continue to have additional pain while taking the ibuprofen or naproxen, you may add in acetaminophen as needed. Your daily total maximum amount of acetaminophen from all sources should be limited to 4000mg /day for persons without liver problems, or 2000mg /day for those with liver problems.

## 2020-11-28 IMAGING — CT CT RENAL STONE PROTOCOL
2 of 4 series · 16 of 46 positions shown, 18 images · non-contrast
Comparison: None.

CLINICAL DATA: Right abdominal pain for 1 hour, hematuria

EXAM:
CT ABDOMEN AND PELVIS WITHOUT CONTRAST
TECHNIQUE: Multidetector CT imaging of the abdomen and pelvis was performed
following the standard protocol without IV contrast.

[Series 2: axial st · axial · 0.70mm/px · z∈[-427,+8]mm · 13 of 95 slices shown, 15 images]
[im 4/95  soft-tissue]
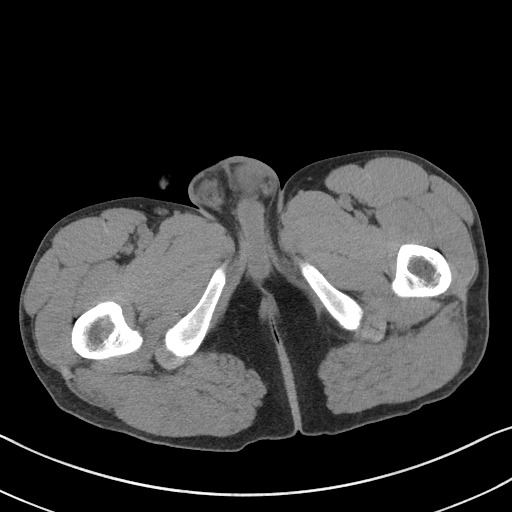
[im 4/95  bone]
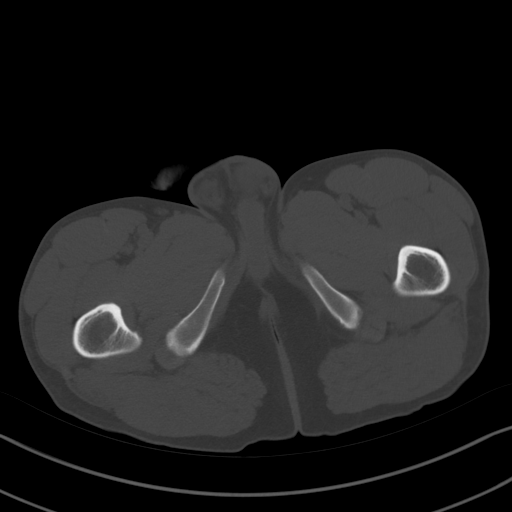
[im 12/95  soft-tissue]
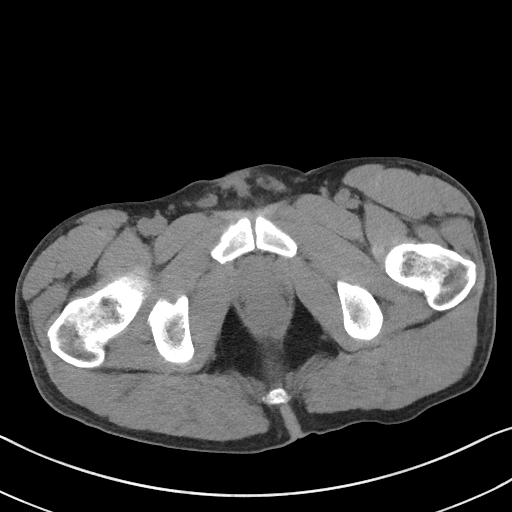
[im 20/95  soft-tissue]
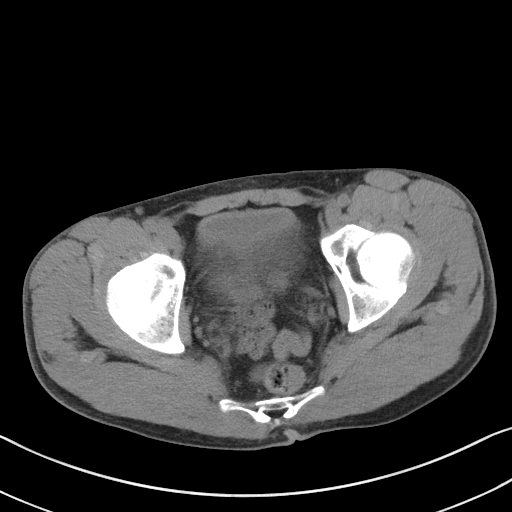
[im 28/95  soft-tissue]
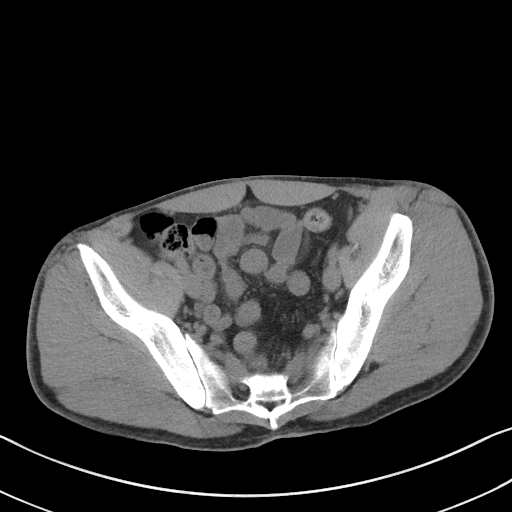
[im 32/95  soft-tissue]
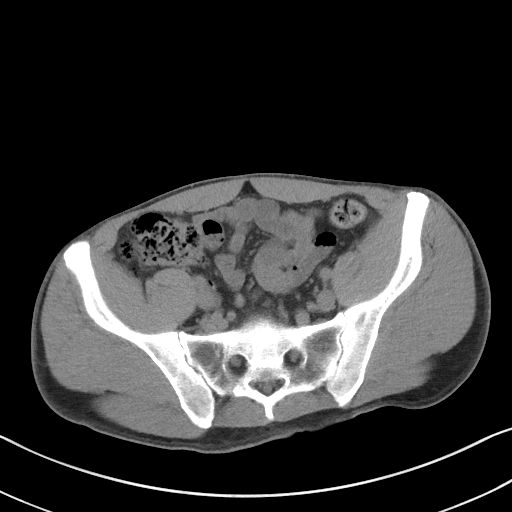
[im 40/95  soft-tissue]
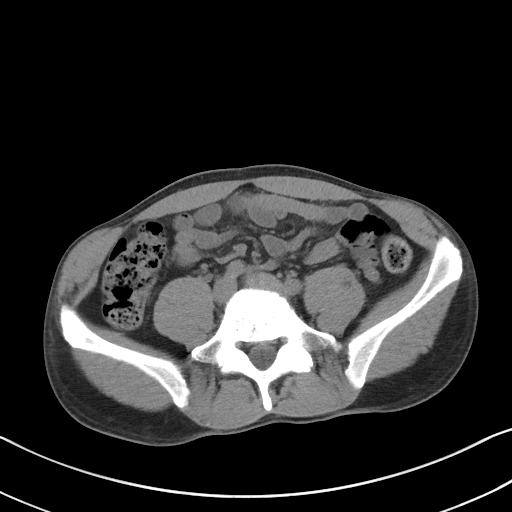
[im 48/95  soft-tissue]
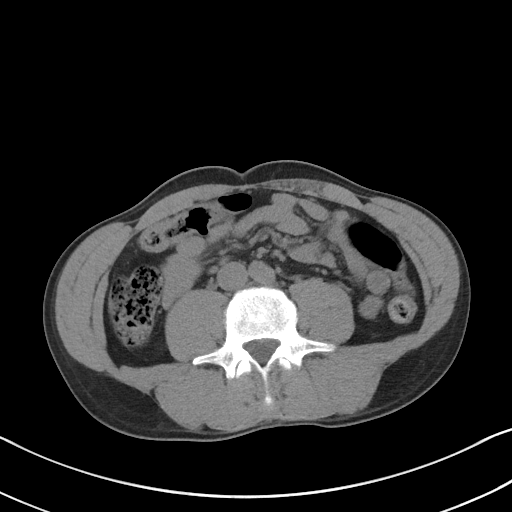
[im 55/95  soft-tissue]
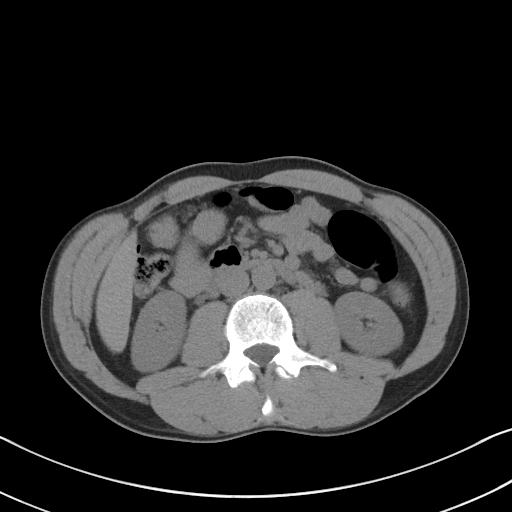
[im 63/95  soft-tissue]
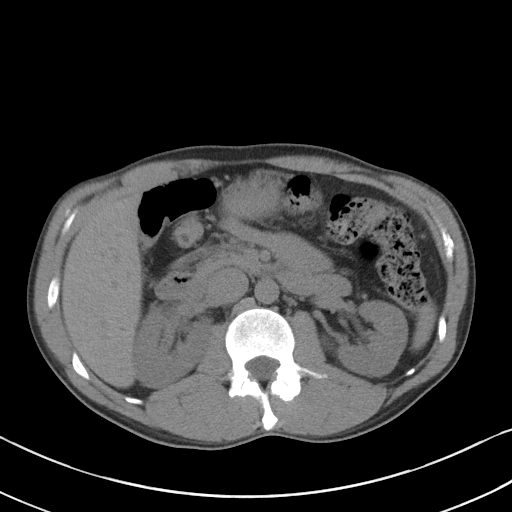
[im 63/95  bone]
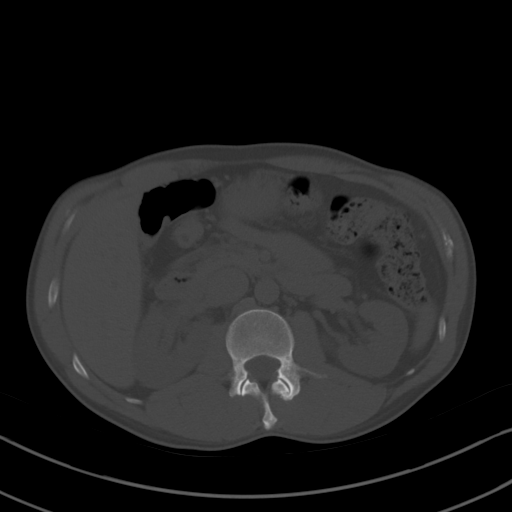
[im 67/95  soft-tissue]
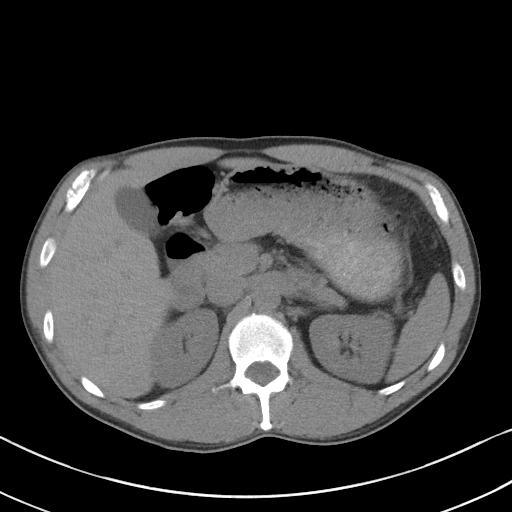
[im 75/95  soft-tissue]
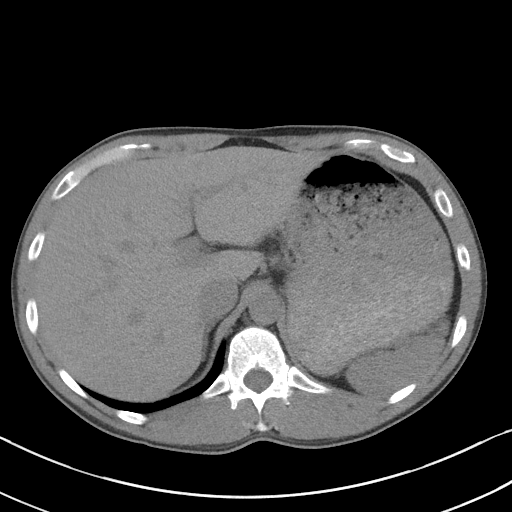
[im 83/95  soft-tissue]
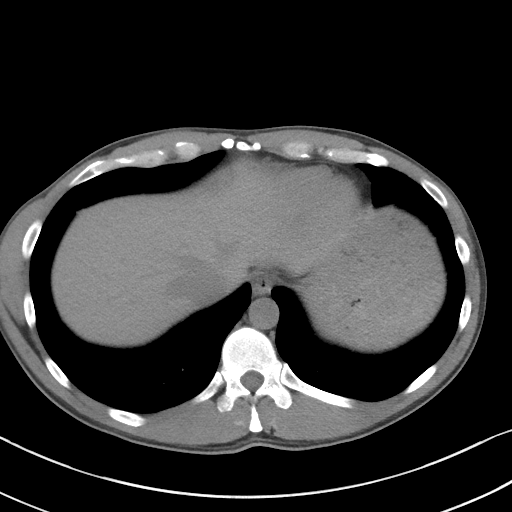
[im 91/95  soft-tissue]
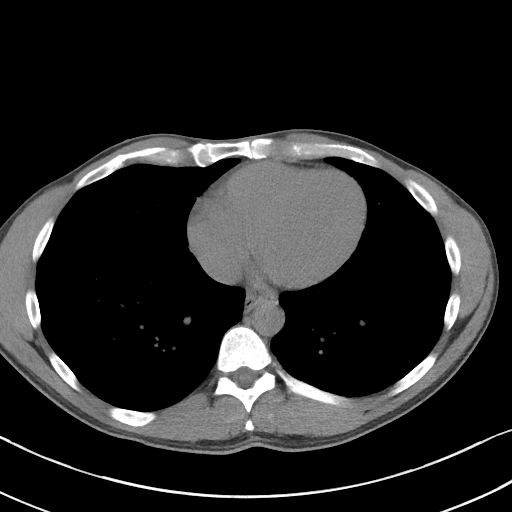

[Series 4: coronal st · coronal · 0.94mm/px · 3 of 74 slices shown]
[im 25/74  soft-tissue]
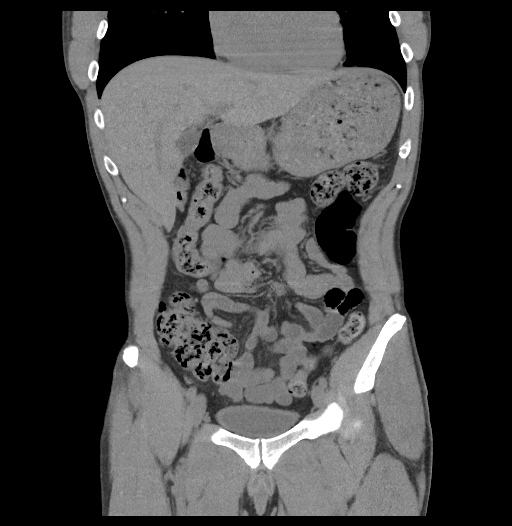
[im 33/74  soft-tissue]
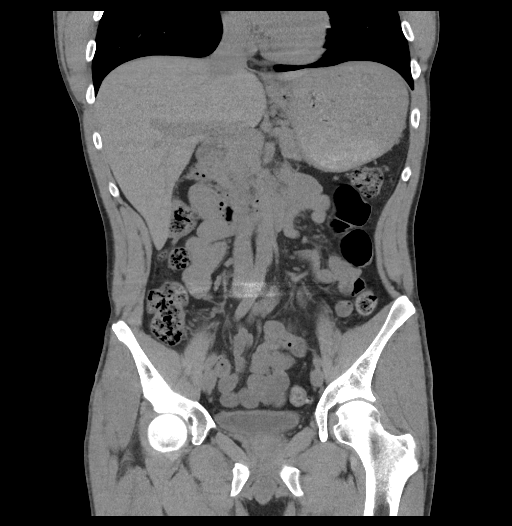
[im 41/74  soft-tissue]
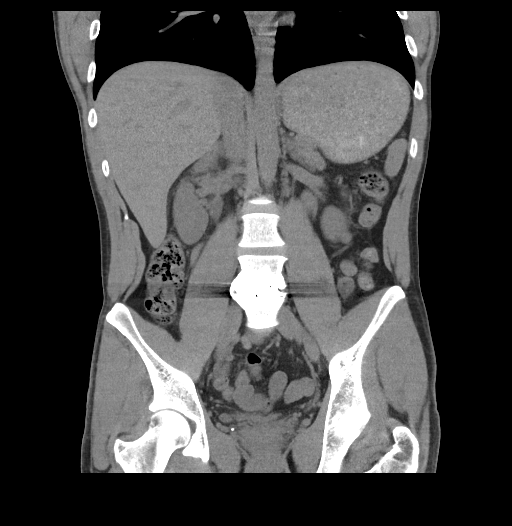

[16 of 46 positions shown; findings below may reference images not displayed]

FINDINGS: Lower chest: Lung bases are clear. Normal heart size. No pericardial
effusion.

Hepatobiliary: No focal liver abnormality is seen. No gallstones,
gallbladder wall thickening, or biliary dilatation.

Pancreas: Unremarkable. No pancreatic ductal dilatation or
surrounding inflammatory changes.

Spleen: Normal in size without focal abnormality. Small accessory
splenule.

Adrenals/Urinary Tract: Normal adrenal glands. No visible or contour
deforming renal lesions. Mild asymmetric right hydroureteronephrosis
with faint right perinephric stranding. No visible obstructing
calculus or other urolithiasis is visualized. Mild circumferential
bladder wall thickening is present though the bladder is under
distended. No bladder calculi or visible urethral calculus.

Stomach/Bowel: Distal esophagus is unremarkable. Stomach is markedly
distended with ingested material duodenal sweep proceed along a
normal course. No small bowel dilatation or wall thickening. Some
fecalization of the distal small bowel contents is noted. No bowel
obstruction. A normal appendix is visualized. No colonic dilatation
or wall thickening.

Vascular/Lymphatic: Atherosclerotic calcifications are seen within
the iliac arteries. No abdominal aortic aneurysm or ectasia. No
suspicious or enlarged lymph nodes in the included lymphatic chains.

Reproductive: The prostate and seminal vesicles are unremarkable.
Visible portions of the external genitalia are free of acute
abnormality.

Other: No abdominopelvic free fluid or free gas. No bowel containing
hernias.

Musculoskeletal: No acute osseous abnormality or suspicious osseous
lesion. L4-5 anterior lumbar spinal fusion cage is noted without
acute complication.
IMPRESSION: 1. Mild asymmetric right hydroureteronephrosis with faint right
perinephric stranding. No visible obstructing calculus or other
urolithiasis is visualized. Findings could reflect recently passed
stone or ascending infection.
2. Mild circumferential bladder wall thickening is present though
the bladder is under distended. Correlate with symptoms and
urinalysis results to exclude cystitis.
3. Marked gastric distention with a ingested material.
4. Fecalization of the distal small bowel contents, correlate for
slowed intestinal transit.
5. Prior L4-5 anterior spinal fusion.
6. Aortic Atherosclerosis ([id number]).

## 2023-06-28 ENCOUNTER — Emergency Department (HOSPITAL_BASED_OUTPATIENT_CLINIC_OR_DEPARTMENT_OTHER): Payer: Medicaid Other

## 2023-06-28 ENCOUNTER — Encounter (HOSPITAL_BASED_OUTPATIENT_CLINIC_OR_DEPARTMENT_OTHER): Payer: Self-pay

## 2023-06-28 ENCOUNTER — Other Ambulatory Visit: Payer: Self-pay

## 2023-06-28 DIAGNOSIS — I10 Essential (primary) hypertension: Secondary | ICD-10-CM | POA: Insufficient documentation

## 2023-06-28 DIAGNOSIS — R2241 Localized swelling, mass and lump, right lower limb: Secondary | ICD-10-CM | POA: Insufficient documentation

## 2023-06-28 NOTE — ED Triage Notes (Signed)
Pt noticed pain and swelling in rt. Lower extremitiy Increased pain when ambulating and states he limps

## 2023-06-29 ENCOUNTER — Emergency Department (HOSPITAL_BASED_OUTPATIENT_CLINIC_OR_DEPARTMENT_OTHER)
Admission: EM | Admit: 2023-06-29 | Discharge: 2023-06-29 | Disposition: A | Discharge status: Home / Self Care | Payer: Medicaid Other | Attending: Emergency Medicine | Admitting: Emergency Medicine

## 2023-06-29 DIAGNOSIS — M7989 Other specified soft tissue disorders: Secondary | ICD-10-CM

## 2023-06-29 NOTE — ED Provider Notes (Signed)
[facility]  Provider Note  CSN: [id number] Arrival date & time: 06/28/23 2203  History Chief Complaint  Patient presents with   Leg Pain    Randall Abbott is a 42 y.o. male with history of HTN reports he noticed some swelling in his R calf and ankle 2 days ago. Not particularly painful. No CP or SOB. He works on his feet but denies any injuries.    Home Medications Prior to Admission medications   Medication Sig Start Date End Date Taking? Authorizing Provider  buprenorphine-naloxone (SUBOXONE) 2-0.5 mg SUBL SL tablet Place 1 tablet under the tongue 2 (two) times daily.    [provider]  dicyclomine (BENTYL) 20 MG tablet Take 1 tablet (20 mg total) by mouth 2 (two) times daily as needed for spasms (or abdominal cramping). 12/22/19   Joy, Shawn C, PA-C  hydrOXYzine (ATARAX/VISTARIL) 25 MG tablet Take 1 tablet (25 mg total) by mouth every 6 (six) hours as needed for anxiety or itching. 12/22/19   Joy, Shawn C, PA-C  methocarbamol (ROBAXIN) 500 MG tablet Take 1 tablet (500 mg total) by mouth 2 (two) times daily. 12/22/19   Joy, Shawn C, PA-C  ondansetron (ZOFRAN ODT) 4 MG disintegrating tablet Take 1 tablet (4 mg total) by mouth every 8 (eight) hours as needed for nausea or vomiting. 12/22/19   Joy, Shawn C, PA-C  ondansetron (ZOFRAN) 4 MG tablet Take 1 tablet (4 mg total) by mouth every 6 (six) hours as needed for nausea or vomiting. 02/20/19   Law, Waylan Boga, PA-C  pregabalin (LYRICA) 50 MG capsule Take 50 mg by mouth at bedtime.    [provider]  testosterone cypionate (DEPOTESTOTERONE CYPIONATE) 100 MG/ML injection 200 mg once a week. Pt takes 100 mg 2x a week - on Sun and Wed 11/22/13   McGowen, Maryjean Morn, MD     Allergies    Morphine and codeine   Review of Systems   Review of Systems Please see HPI for pertinent positives and negatives  Physical Exam BP (!) 189/110 (BP Location: Right Arm)   Pulse 83   Temp  97.7 F (36.5 C)   Resp 18   Ht 5\' 9"  (1.753 m)   Wt 79.4 kg   SpO2 97%   BMI 25.84 kg/m   Physical Exam Vitals and nursing note reviewed.  HENT:     Head: Normocephalic.     Nose: Nose normal.  Eyes:     Extraocular Movements: Extraocular movements intact.  Pulmonary:     Effort: Pulmonary effort is normal.  Musculoskeletal:        General: Swelling (mild, R calf/ankle) present. No tenderness. Normal range of motion.     Cervical back: Neck supple.  Skin:    Findings: No rash (on exposed skin).  Neurological:     Mental Status: He is alert and oriented to person, place, and time.  Psychiatric:        Mood and Affect: Mood normal.     ED Results / Procedures / Treatments   EKG None  Procedures Procedures  Medications Ordered in the ED Medications - No data to display  Initial Impression and Plan  Patient here with isolated R leg swelling. Otherwise well appearing with benign exam. I personally viewed the images from radiology studies and agree with radiologist interpretation: [place] is neg for DVT. Suspect ruptured baker's cyst. Recommend he keep the leg elevated, NSAIDS for pain. PCP follow  up, RTED for any other concerns.      ED Course       MDM Rules/Calculators/A&P Medical Decision Making Problems Addressed: Right leg swelling: acute illness or injury  Amount and/or Complexity of Data Reviewed Radiology: ordered and independent interpretation performed. Decision-making details documented in ED Course.     Final Clinical Impression(s) / ED Diagnoses Final diagnoses:  Right leg swelling    Rx / DC Orders ED Discharge Orders     None        Pollyann Savoy, MD 06/29/23 [phone]
